# Patient Record
Sex: Female | Born: 1975 | Race: White | Hispanic: No | Marital: Married | Smoking: Former smoker
Health system: Southern US, Community
[De-identification: ages and names within clinical notes are randomized; demographics above are authoritative.]

## PROBLEM LIST (undated history)

## (undated) DIAGNOSIS — G43909 Migraine, unspecified, not intractable, without status migrainosus: Secondary | ICD-10-CM

## (undated) DIAGNOSIS — R87610 Atypical squamous cells of undetermined significance on cytologic smear of cervix (ASC-US): Secondary | ICD-10-CM

## (undated) DIAGNOSIS — Z1379 Encounter for other screening for genetic and chromosomal anomalies: Secondary | ICD-10-CM

## (undated) DIAGNOSIS — T7840XA Allergy, unspecified, initial encounter: Secondary | ICD-10-CM

## (undated) DIAGNOSIS — Z9289 Personal history of other medical treatment: Secondary | ICD-10-CM

## (undated) DIAGNOSIS — Z803 Family history of malignant neoplasm of breast: Secondary | ICD-10-CM

## (undated) DIAGNOSIS — T8332XS Displacement of intrauterine contraceptive device, sequela: Secondary | ICD-10-CM

## (undated) HISTORY — DX: Encounter for other screening for genetic and chromosomal anomalies: Z13.79

## (undated) HISTORY — DX: Family history of malignant neoplasm of breast: Z80.3

## (undated) HISTORY — PX: BREAST ENHANCEMENT SURGERY: SHX7

## (undated) HISTORY — DX: Atypical squamous cells of undetermined significance on cytologic smear of cervix (ASC-US): R87.610

## (undated) HISTORY — DX: Migraine, unspecified, not intractable, without status migrainosus: G43.909

## (undated) HISTORY — DX: Allergy, unspecified, initial encounter: T78.40XA

## (undated) HISTORY — DX: Personal history of other medical treatment: Z92.89

---

## 2004-11-11 ENCOUNTER — Ambulatory Visit: Payer: Self-pay | Admitting: Anesthesiology

## 2006-09-19 ENCOUNTER — Observation Stay: Payer: Self-pay | Admitting: Obstetrics & Gynecology

## 2006-09-20 ENCOUNTER — Inpatient Hospital Stay: Payer: Self-pay | Admitting: Unknown Physician Specialty

## 2009-07-10 ENCOUNTER — Ambulatory Visit: Payer: Self-pay

## 2009-07-11 ENCOUNTER — Inpatient Hospital Stay: Payer: Self-pay

## 2011-05-03 DIAGNOSIS — R87610 Atypical squamous cells of undetermined significance on cytologic smear of cervix (ASC-US): Secondary | ICD-10-CM | POA: Insufficient documentation

## 2011-05-03 HISTORY — DX: Atypical squamous cells of undetermined significance on cytologic smear of cervix (ASC-US): R87.610

## 2011-08-03 HISTORY — PX: INTRAUTERINE DEVICE (IUD) INSERTION: SHX5877

## 2012-11-02 DIAGNOSIS — Z803 Family history of malignant neoplasm of breast: Secondary | ICD-10-CM

## 2012-11-02 HISTORY — DX: Family history of malignant neoplasm of breast: Z80.3

## 2013-06-02 DIAGNOSIS — Z1379 Encounter for other screening for genetic and chromosomal anomalies: Secondary | ICD-10-CM

## 2013-06-02 HISTORY — DX: Encounter for other screening for genetic and chromosomal anomalies: Z13.79

## 2013-11-02 HISTORY — PX: AUGMENTATION MAMMAPLASTY: SUR837

## 2015-06-25 DIAGNOSIS — Z9289 Personal history of other medical treatment: Secondary | ICD-10-CM

## 2015-06-25 HISTORY — DX: Personal history of other medical treatment: Z92.89

## 2015-06-25 LAB — HM PAP SMEAR: HM Pap smear: NEGATIVE

## 2015-06-25 LAB — HM MAMMOGRAPHY

## 2017-07-26 ENCOUNTER — Encounter: Payer: Self-pay | Admitting: Obstetrics and Gynecology

## 2017-07-26 ENCOUNTER — Ambulatory Visit (INDEPENDENT_AMBULATORY_CARE_PROVIDER_SITE_OTHER): Payer: BC Managed Care – PPO | Admitting: Obstetrics and Gynecology

## 2017-07-26 VITALS — BP 138/90 | HR 84 | Ht 67.0 in | Wt 135.0 lb

## 2017-07-26 DIAGNOSIS — Z1151 Encounter for screening for human papillomavirus (HPV): Secondary | ICD-10-CM | POA: Diagnosis not present

## 2017-07-26 DIAGNOSIS — Z124 Encounter for screening for malignant neoplasm of cervix: Secondary | ICD-10-CM | POA: Diagnosis not present

## 2017-07-26 DIAGNOSIS — Z01419 Encounter for gynecological examination (general) (routine) without abnormal findings: Secondary | ICD-10-CM | POA: Diagnosis not present

## 2017-07-26 DIAGNOSIS — Z30431 Encounter for routine checking of intrauterine contraceptive device: Secondary | ICD-10-CM

## 2017-07-26 DIAGNOSIS — Z803 Family history of malignant neoplasm of breast: Secondary | ICD-10-CM

## 2017-07-26 NOTE — Progress Notes (Signed)
PCP:  Patient, No Pcp Per   Chief Complaint  Patient presents with  . Gynecologic Exam     HPI:      [name] is a 41 y.o. [id number] who LMP was Patient's last menstrual period was 05/22/2014 (lmp unknown)., presents today for her annual examination.  Her menses are absent with IUD. Dysmenorrhea none. She does not have intermenstrual bleeding. Pt states her husband notices she is sweating at night, but pt isn't drenching and it doesn't even wake her up. She is cold during the day.  Sex activity: single partner, contraception - IUD. Mirena placed 08/19/16. Strings not visible at 4 wk f/u appt, but IUD in place confirmed with u/s.  Last Pap: June 25, 2015  Results were: no abnormalities . Had neg HPV DNA 2015 Hx of STDs: none  There is a FH of breast cancer in her MGM and pat grt aunt, genetic testing not indicated but done in the past when we thought MGM was younger with breast cancer dx. Pt is BRCA/BART neg 2014.There is no FH of ovarian cancer. The patient does do self-breast exams.  Tobacco use: The patient denies current or previous tobacco use. Alcohol use: social drinker No drug use.  Exercise: not active  She does not get adequate calcium and Vitamin D in her diet.   Past Medical History:  Diagnosis Date  . ASCUS of cervix with negative high risk HPV 05/2011  . Family history of breast cancer 2014   IBIS=17.9%  . Genetic testing of female 06/2013   BRCA/BART NEG  . History of Papanicolaou smear of cervix 06/25/2015   NEG  . Migraine    DR. MEAD    Past Surgical History:  Procedure Laterality Date  . CESAREAN SECTION  09/21/2006   OLIGO  . CESAREAN SECTION  07/11/2009   PREV C/S  . INTRAUTERINE DEVICE (IUD) INSERTION  08/2011   MIRENA    Family History  Problem Relation Age of Onset  . Hypertension Father   . Migraines Father   . Migraines Brother   . Breast cancer Maternal Grandmother 51       DECEASED AGE 59  . Cancer Maternal Grandfather  18       MELANOMA OF SKIN  . Heart disease Paternal Grandfather        MI  . Breast cancer Other        MASTECTOMY  . Cancer Mother 6       thyroid ca - taken out  . Crohn's disease Mother     Social History   Social History  . Marital status: Married    Spouse name: N/A  . Number of children: 2  . Years of education: 16   Occupational History  . TEACHER    Social History Main Topics  . Smoking status: Former Research scientist (life sciences)  . Smokeless tobacco: Never Used  . Alcohol use Yes     Comment: OCC  . Drug use: No  . Sexual activity: Yes    Birth control/ protection: IUD   Other Topics Concern  . Not on file   Social History Narrative  . No narrative on file    Current Meds  Medication Sig  . levonorgestrel (MIRENA) 20 MCG/24HR IUD 1 each by Intrauterine route once.     ROS:  Review of Systems  Constitutional: Negative for fatigue, fever and unexpected weight change.  Respiratory: Negative for cough, shortness of breath and wheezing.   Cardiovascular: Negative for  chest pain, palpitations and leg swelling.  Gastrointestinal: Negative for blood in stool, constipation, diarrhea, nausea and vomiting.  Endocrine: Negative for cold intolerance, heat intolerance and polyuria.  Genitourinary: Negative for dyspareunia, dysuria, flank pain, frequency, genital sores, hematuria, menstrual problem, pelvic pain, urgency, vaginal bleeding, vaginal discharge and vaginal pain.  Musculoskeletal: Negative for back pain, joint swelling and myalgias.  Skin: Negative for rash.  Neurological: Negative for dizziness, syncope, light-headedness, numbness and headaches.  Hematological: Negative for adenopathy.  Psychiatric/Behavioral: Negative for agitation, confusion, sleep disturbance and suicidal ideas. The patient is not nervous/anxious.      Objective: BP 138/90   Pulse 84   Ht _0  (1.702 m)   Wt 135 lb (61.2 kg)   LMP 05/22/2014 (LMP Unknown) Comment: IUD  BMI 21.14 kg/m     Physical Exam  Constitutional: She is oriented to person, place, and time. She appears well-developed and well-nourished.  Genitourinary: Vagina normal and uterus normal. There is no rash or tenderness on the right labia. There is no rash or tenderness on the left labia. No erythema or tenderness in the vagina. No vaginal discharge found. Right adnexum does not display mass and does not display tenderness. Left adnexum does not display mass and does not display tenderness. Cervix does not exhibit motion tenderness or polyp. Uterus is not enlarged or tender.  Genitourinary Comments: IUD STRINGS NOT VISIBLE/PALPABLE  Neck: Normal range of motion. No thyromegaly present.  Cardiovascular: Normal rate, regular rhythm and normal heart sounds.   No murmur heard. Pulmonary/Chest: Effort normal and breath sounds normal. Right breast exhibits no mass, no nipple discharge, no skin change and no tenderness. Left breast exhibits no mass, no nipple discharge, no skin change and no tenderness.  Abdominal: Soft. There is no tenderness. There is no guarding.  Musculoskeletal: Normal range of motion.  Neurological: She is alert and oriented to person, place, and time. No cranial nerve deficit.  Psychiatric: She has a normal mood and affect. Her behavior is normal.  Vitals reviewed.   Assessment/Plan: Encounter for annual routine gynecological examination  Cervical cancer screening - Plan: IGP, Aptima HPV  Screening for HPV (human papillomavirus) - Plan: IGP, Aptima HPV  Family history of breast cancer - Pt doesn't qualify for updated testing. Mammos age 89. Cont SBE.  Encounter for routine checking of intrauterine contraceptive device (IUD) - IUD in place on u/s 12/17 but no strings visible.  No orders of the defined types were placed in this encounter.            GYN counsel breast self exam, mammography screening, adequate intake of calcium and vitamin D, diet and exercise     F/U  Return in  about 1 year (around 07/26/2018).  Alicia B. Copland, PA-C 07/26/2017 2:05 PM

## 2017-07-28 LAB — IGP, APTIMA HPV
HPV Aptima: NEGATIVE
PAP SMEAR COMMENT: 0

## 2018-02-15 ENCOUNTER — Ambulatory Visit (INDEPENDENT_AMBULATORY_CARE_PROVIDER_SITE_OTHER): Payer: BC Managed Care – PPO | Admitting: Nurse Practitioner

## 2018-02-15 ENCOUNTER — Other Ambulatory Visit: Payer: Self-pay

## 2018-02-15 ENCOUNTER — Encounter: Payer: Self-pay | Admitting: Nurse Practitioner

## 2018-02-15 VITALS — BP 109/47 | HR 98 | Temp 98.5°F | Ht 67.0 in | Wt 136.2 lb

## 2018-02-15 DIAGNOSIS — Z7689 Persons encountering health services in other specified circumstances: Secondary | ICD-10-CM

## 2018-02-15 DIAGNOSIS — R87610 Atypical squamous cells of undetermined significance on cytologic smear of cervix (ASC-US): Secondary | ICD-10-CM | POA: Diagnosis not present

## 2018-02-15 DIAGNOSIS — Z23 Encounter for immunization: Secondary | ICD-10-CM

## 2018-02-15 NOTE — Progress Notes (Signed)
Subjective:    Patient ID: Tanya Gillespie, female    DOB: [birth year], 42 y.o.   MRN: [id number]  Tanya Gillespie is a 42 y.o. female presenting on 02/15/2018 for [place] Provider Most recent primary care by [place].  Records in Epic  Abnormal Pap Pt has history of ASCUS and negative HPV with her last pap smear.  She is not currently symptomatic, but requests screening PAP smear to re-evaluate for cervical cancer.  She did not have any treatment performed at that time.  She did not receive HPV vaccine.  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  Mother hx thyroid ca: crohn's  Health Maintenance: Tetanus vaccine due  Past Medical History:  Diagnosis Date  . ASCUS of cervix with negative high risk HPV 05/2011  . Family history of breast cancer 2014   IBIS=17.9%  . Genetic testing of female 06/2013   BRCA/BART NEG  . History of Papanicolaou smear of cervix 06/25/2015   NEG  . Migraine    DR. MEAD   Past Surgical History:  Procedure Laterality Date  . BREAST ENHANCEMENT SURGERY    . CESAREAN SECTION  09/21/2006   OLIGO  . CESAREAN SECTION  07/11/2009   PREV C/S  . INTRAUTERINE DEVICE (IUD) INSERTION  08/2011   MIRENA   Social History   Socioeconomic History  . Marital status: Married    Spouse name: Not on file  . Number of children: 2  . Years of education: 8  . Highest education level: Not on file  Occupational History  . Occupation: TEACHER  Social Needs  . Financial resource strain: Not on file  . Food insecurity:    Worry: Not on file    Inability: Not on file  . Transportation needs:    Medical: Not on file    Non-medical: Not on file  Tobacco Use  . Smoking status: Former Smoker    Packs/day: 0.50    Years: 2.00    Pack years: 1.00    Types: Cigarettes  . Smokeless tobacco: Never Used  Substance and Sexual Activity  . Alcohol use: Yes    Alcohol/week: 1.2 oz    Types: 2 Cans of beer per week   Comment: OCC  . Drug use: No  . Sexual activity: Yes    Birth control/protection: IUD  Lifestyle  . Physical activity:    Days per week: Not on file    Minutes per session: Not on file  . Stress: Not on file  Relationships  . Social connections:    Talks on phone: Not on file    Gets together: Not on file    Attends religious service: Not on file    Active member of club or organization: Not on file    Attends meetings of clubs or organizations: Not on file    Relationship status: Not on file  . Intimate partner violence:    Fear of current or ex partner: Not on file    Emotionally abused: Not on file    Physically abused: Not on file    Forced sexual activity: Not on file  Other Topics Concern  . Not on file  Social History Narrative  . Not on file   Family History  Problem Relation Age of Onset  . Hypertension Father   . Migraines Father   . Migraines Brother   . Breast cancer Maternal Grandmother 6  DECEASED AGE 3  . Cancer Maternal Grandfather 54       MELANOMA OF SKIN  . Heart disease Paternal Grandfather        MI  . Breast cancer Other        MASTECTOMY  . Cancer Mother 62       thyroid ca - taken out  . Crohn's disease Mother    Current Outpatient Medications on File Prior to Visit  Medication Sig  . Cholecalciferol (PA VITAMIN D-3 GUMMY PO) Take by mouth.  . clarithromycin (BIAXIN) 500 MG tablet Take 500 mg by mouth 2 (two) times daily.  Marland Kitchen levonorgestrel (MIRENA) 20 MCG/24HR IUD 1 each by Intrauterine route once.  . tobramycin-dexamethasone (TOBRADEX) ophthalmic solution    No current facility-administered medications on file prior to visit.     Review of Systems  Constitutional: Negative for chills and fever.  HENT: Negative for congestion and sore throat.   Eyes: Negative for pain.  Respiratory: Negative for cough, shortness of breath and wheezing.   Cardiovascular: Negative for chest pain, palpitations and leg swelling.  Gastrointestinal:  Negative for abdominal pain, blood in stool, constipation, diarrhea, nausea and vomiting.  Endocrine: Negative for polydipsia.  Genitourinary: Negative for dysuria, frequency, hematuria and urgency.  Musculoskeletal: Negative for back pain, myalgias and neck pain.  Skin: Negative.  Negative for rash.  Allergic/Immunologic: Negative for environmental allergies.  Neurological: Negative for dizziness, weakness and headaches.  Hematological: Does not bruise/bleed easily.  Psychiatric/Behavioral: Negative for dysphoric mood and suicidal ideas. The patient is not nervous/anxious.    Per HPI unless specifically indicated above      Objective:    BP (!) 109/47 (BP Location: Right Arm, Patient Position: Sitting, Cuff Size: Normal)   Pulse 98   Temp 98.5 F (36.9 C) (Oral)   Ht '5\' 7"'  (1.702 m)   Wt 136 lb 3.2 oz (61.8 kg)   BMI 21.33 kg/m   Wt Readings from Last 3 Encounters:  02/15/18 136 lb 3.2 oz (61.8 kg)  07/26/17 135 lb (61.2 kg)  10/12/16 136 lb (61.7 kg)    Physical Exam  Constitutional: She is oriented to person, place, and time. She appears well-developed and well-nourished. No distress.  HENT:  Head: Normocephalic and atraumatic.  Cardiovascular: Normal rate, regular rhythm, S1 normal, S2 normal, normal heart sounds and intact distal pulses.  Pulmonary/Chest: Effort normal and breath sounds normal. No respiratory distress.  Neurological: She is alert and oriented to person, place, and time.  Skin: Skin is warm and dry.  Psychiatric: She has a normal mood and affect. Her behavior is normal.  Vitals reviewed.  Results for orders placed or performed in visit on 07/26/17  IGP, Aptima HPV  Result Value Ref Range   DIAGNOSIS: Comment    Specimen adequacy: Comment    Clinician Provided ICD10 Comment    Performed by: Comment    PAP Smear Comment .    Note: Comment    Test Methodology Comment    HPV Aptima Negative Negative      Assessment & Plan:   Problem List Items  Addressed This Visit      Other   ASCUS of cervix with negative high risk HPV No followup after last ASCUS.  Pt desires repeat PAP smear.  No current symptoms or pain to indicate any changes.    Other Visit Diagnoses    Need for diphtheria-tetanus-pertussis (Tdap) vaccine    -  Primary Pt due today for tetanus vaccination.  Administer Tdap vaccine.   Relevant Orders   Tdap vaccine greater than or equal to 7yo IM (Completed)   Encounter to establish care     Previous primary care was at [facility].  Records will be requested.  Past medical, family, and surgical history reviewed w/ pt.         Follow up plan: Return in about 8 weeks (around 04/11/2018) for annual physical.  Cassell Smiles, DNP, AGPCNP-BC Adult Gerontology Primary Care Nurse Practitioner [facility] 02/15/2018, 2:42 PM

## 2018-02-15 NOTE — Patient Instructions (Addendum)
Tanya Gillespie,   Thank you for coming in to clinic today.  You will be due for FASTING BLOOD WORK.  This means you should eat no food or drink after midnight.  Drink only water or coffee without cream/sugar on the morning of your lab visit. - Please go ahead and schedule a "Lab Only" visit in the morning at the clinic for lab draw at your annual physical. - Your results will be available about 2-3 days after blood draw.  If you have set up a MyChart account, you can can log in to MyChart online to view your results and a brief explanation. Also, we can discuss your results together at your next office visit if you would like.   Please schedule a follow-up appointment with Wilhelmina McardleLauren Belal Scallon, AGNP. Return in about 8 weeks (around 04/11/2018) for annual physical.  If you have any other questions or concerns, please feel free to call the clinic or send a message through MyChart. You may also schedule an earlier appointment if necessary.  You will receive a survey after today's visit either digitally by e-mail or paper by [employer] mail. Your experiences and feedback matter to Tanya Gillespie.  Please respond so we know how we are doing as we provide care for you.   Wilhelmina McardleLauren Liviya Santini, DNP, AGNP-BC Adult Gerontology Nurse Practitioner [facility], [facility]

## 2018-02-16 DIAGNOSIS — Z23 Encounter for immunization: Secondary | ICD-10-CM | POA: Diagnosis not present

## 2018-04-05 ENCOUNTER — Other Ambulatory Visit: Payer: Self-pay

## 2018-04-05 ENCOUNTER — Encounter: Payer: Self-pay | Admitting: Nurse Practitioner

## 2018-04-05 ENCOUNTER — Ambulatory Visit (INDEPENDENT_AMBULATORY_CARE_PROVIDER_SITE_OTHER): Payer: BC Managed Care – PPO | Admitting: Nurse Practitioner

## 2018-04-05 VITALS — BP 103/60 | HR 83 | Temp 98.7°F | Ht 67.0 in | Wt 137.8 lb

## 2018-04-05 DIAGNOSIS — Z0000 Encounter for general adult medical examination without abnormal findings: Secondary | ICD-10-CM

## 2018-04-05 NOTE — Progress Notes (Signed)
Subjective:    Patient ID: Tanya Gillespie, female    DOB: 13-Feb-1976, 42 y.o.   MRN: [id number]  Tanya Gillespie is a 42 y.o. female presenting on 04/05/2018 for Annual Exam and low sex drive (x 1-2 mths)   HPI Annual Physical Exam Patient has been feeling well, a little down and less "bubbly".  They have no other acute concerns today. Sleeps 7-9 hours per night uninterrupted.  HEALTH MAINTENANCE: Weight/BMI: stable and healthy Physical activity: 3x per week Diet: generally healthy, fast food occasionally Seatbelt: always Sunscreen: sometimes PAP: August 2018 - [facility] Mammogram: baseline obtained at 78.  Family history of breast ca.  Will likely resume w/ annual mammo at age 63. HIV/HEP C: mutually monogamous relationship Optometry: regular Dentistry: regular  VACCINES: Tetanus: April 2019  Depression screen [facility] 2/9 04/05/2018 02/15/2018  Decreased Interest 1 0  Down, Depressed, Hopeless 1 0  PHQ - 2 Score 2 0  Altered sleeping 0 -  Tired, decreased energy 0 -  Change in appetite 0 -  Feeling bad or failure about yourself  0 -  Trouble concentrating 0 -  Moving slowly or fidgety/restless 0 -  Suicidal thoughts 0 -  PHQ-9 Score 2 -  Difficult doing work/chores Not difficult at all -   GAD 7 : Generalized Anxiety Score 04/05/2018  Nervous, Anxious, on Edge 0  Control/stop worrying 0  Worry too much - different things 0  Trouble relaxing 0  Restless 0  Easily annoyed or irritable 1  Afraid - awful might happen 0  Total GAD 7 Score 1  Anxiety Difficulty Not difficult at all     Past Medical History:  Diagnosis Date  . ASCUS of cervix with negative high risk HPV 05/2011  . Family history of breast cancer 2014   IBIS=17.9%  . Genetic testing of female 06/2013   BRCA/BART NEG  . History of Papanicolaou smear of cervix 06/25/2015   NEG  . Migraine    DR. MEAD   Past Surgical History:  Procedure Laterality Date  . BREAST ENHANCEMENT SURGERY    . CESAREAN  SECTION  09/21/2006   OLIGO  . CESAREAN SECTION  07/11/2009   PREV C/S  . INTRAUTERINE DEVICE (IUD) INSERTION  08/2011   MIRENA   Social History   Socioeconomic History  . Marital status: Married    Spouse name: Not on file  . Number of children: 2  . Years of education: 16  . Highest education level: Bachelor's degree (e.g., BA, AB, BS)  Occupational History  . Occupation: TEACHER  Social Needs  . Financial resource strain: Not on file  . Food insecurity:    Worry: Not on file    Inability: Not on file  . Transportation needs:    Medical: Not on file    Non-medical: Not on file  Tobacco Use  . Smoking status: Former Smoker    Packs/day: 0.50    Years: 2.00    Pack years: 1.00    Types: Cigarettes  . Smokeless tobacco: Never Used  Substance and Sexual Activity  . Alcohol use: Yes    Alcohol/week: 1.2 oz    Types: 2 Cans of beer per week    Comment: OCC  . Drug use: No  . Sexual activity: Yes    Birth control/protection: IUD  Lifestyle  . Physical activity:    Days per week: Not on file    Minutes per session: Not on file  . Stress: Not  on file  Relationships  . Social connections:    Talks on phone: Not on file    Gets together: Not on file    Attends religious service: Not on file    Active member of club or organization: Not on file    Attends meetings of clubs or organizations: Not on file    Relationship status: Not on file  . Intimate partner violence:    Fear of current or ex partner: No    Emotionally abused: No    Physically abused: No    Forced sexual activity: No  Other Topics Concern  . Not on file  Social History Narrative  . Not on file   Family History  Problem Relation Age of Onset  . Hypertension Father   . Migraines Father   . Migraines Brother   . Breast cancer Maternal Grandmother 21       DECEASED AGE 89  . Cancer Maternal Grandfather 3       MELANOMA OF SKIN  . Heart disease Paternal Grandfather        MI  . Breast cancer  Other        MASTECTOMY  . Cancer Mother 8       thyroid ca - taken out  . Crohn's disease Mother   . Alzheimer's disease Paternal Grandmother    Current Outpatient Medications on File Prior to Visit  Medication Sig  . Cholecalciferol (PA VITAMIN D-3 GUMMY PO) Take by mouth.  . levonorgestrel (MIRENA) 20 MCG/24HR IUD 1 each by Intrauterine route once.   No current facility-administered medications on file prior to visit.     Review of Systems  Constitutional: Positive for fatigue. Negative for chills and fever.  HENT: Negative for congestion and sore throat.   Eyes: Negative for pain.  Respiratory: Negative for cough, shortness of breath and wheezing.   Cardiovascular: Negative for chest pain, palpitations and leg swelling.  Gastrointestinal: Negative for abdominal pain, blood in stool, constipation, diarrhea, nausea and vomiting.  Endocrine: Negative for polydipsia.  Genitourinary: Negative for dysuria, frequency, hematuria and urgency.  Musculoskeletal: Negative for back pain, myalgias and neck pain.  Skin: Negative.  Negative for rash.  Allergic/Immunologic: Negative for environmental allergies.  Neurological: Negative for dizziness, weakness and headaches.  Hematological: Does not bruise/bleed easily.  Psychiatric/Behavioral: Negative for dysphoric mood, sleep disturbance and suicidal ideas. The patient is not nervous/anxious.        Mildly down, more withdrawn socially   Per HPI unless specifically indicated above     Objective:    BP 103/60 (BP Location: Right Arm, Patient Position: Sitting, Cuff Size: Normal)   Pulse 83   Temp 98.7 F (37.1 C) (Oral)   Ht '5\' 7"'  (1.702 m)   Wt 137 lb 12.8 oz (62.5 kg)   BMI 21.58 kg/m   Wt Readings from Last 3 Encounters:  04/05/18 137 lb 12.8 oz (62.5 kg)  02/15/18 136 lb 3.2 oz (61.8 kg)  07/26/17 135 lb (61.2 kg)    Physical Exam  Constitutional: She is oriented to person, place, and time. She appears well-developed and  well-nourished. No distress.  HENT:  Head: Normocephalic and atraumatic.  Right Ear: External ear normal.  Left Ear: External ear normal.  Nose: Nose normal.  Mouth/Throat: Oropharynx is clear and moist.  Eyes: Pupils are equal, round, and reactive to light. Conjunctivae are normal.  Neck: Normal range of motion. Neck supple. No JVD present. No tracheal deviation present. No thyromegaly present.  Cardiovascular: Normal rate, regular rhythm, normal heart sounds and intact distal pulses. Exam reveals no gallop and no friction rub.  No murmur heard. Pulmonary/Chest: Effort normal and breath sounds normal. No respiratory distress.  Breast - Normal exam w/ symmetric breasts, no mass, no nipple discharge, no skin changes or tenderness.  Pt with stable breast implants.    Abdominal: Soft. Bowel sounds are normal. She exhibits no distension. There is no tenderness.  Genitourinary:  Genitourinary Comments: Exam deferred by pt to GYN  Musculoskeletal: Normal range of motion.  Lymphadenopathy:    She has no cervical adenopathy.  Neurological: She is alert and oriented to person, place, and time. No cranial nerve deficit.  Skin: Skin is warm and dry. Capillary refill takes less than 2 seconds.  Psychiatric: She has a normal mood and affect. Her behavior is normal. Judgment and thought content normal.  Nursing note and vitals reviewed.    Results for orders placed or performed in visit on 07/26/17  IGP, Aptima HPV  Result Value Ref Range   DIAGNOSIS: Comment    Specimen adequacy: Comment    Clinician Provided ICD10 Comment    Performed by: Comment    PAP Smear Comment .    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  Well adult w/ acute concerns about very mild social withdrawal.  No anhedonia noted and no clinically significant depression found today. Denies SI/HI and has no plans to carry out if SI/HI arise.   Plan: 1. Obtain health maintenance screenings as above according to age. - Increase physical activity to 30 minutes most days of the week.  - Eat healthy diet high in vegetables and fruits; low in refined carbohydrates. 2. Encouraged stress management skills, personal time for rejuvenation. 3. Decreased libido likely connected, work to plan date nights to increase this interest. 4. Consider counseling/therapy as pt moves into new life phase after school program completed. 5. Monitor nevus for change over time.  Recommend dermatology screening. 6. Return 1 year for annual physical and in 6-8 weeks if moods not improving or at any time for worsening with anhedonia/SI.    Follow up plan: Return in about 1 year (around 04/06/2019) for annual physical AND as needed in 6-8 weeks if moods are not improving.Cassell Smiles, DNP, AGPCNP-BC Adult Gerontology Primary Care Nurse Practitioner [facility] 04/05/2018, 11:56 AM

## 2018-04-05 NOTE — Patient Instructions (Addendum)
Kathi Deriffany J Inscore,   Thank you for coming in to clinic today.  1. Decreased libido and general mood: - invest some time in yourself to do something fun, intentionally decreasing stress at least 5 minutes daily.  Work on 30 minutes 3-4 days per week. - Plan date night and prepare in advance - Consider counseling/therapist as needed.  2. Continue eating healthy diet and staying physically active.  3. Lab results will be released to mychart once they return.  COUNSELING ONLY  Self Referral: 1. [facility]   Address: [address], [place], [place] [address] Hours: Open today  9AM-7PM Phone: [phone]  2. Anell Barrheryl [facility] [employer]  - [facility] Address: [address], [place], [place] [address] Phone: [phone]   Please schedule a follow-up appointment with Wilhelmina McardleLauren Haroldine Redler, AGNP. Return in about 1 year (around 04/06/2019) for annual physical AND as needed in 6-8 weeks if moods are not improving..  If you have any other questions or concerns, please feel free to call the clinic or send a message through MyChart. You may also schedule an earlier appointment if necessary.  You will receive a survey after today's visit either digitally by e-mail or paper by [employer] mail. Your experiences and feedback matter to us.  Please respond so we know how we are doing as we provide care for you.   Wilhelmina McardleLauren Terrill Alperin, DNP, AGNP-BC Adult Gerontology Nurse Practitioner [facility] [place] [facility], [facility]

## 2018-04-06 LAB — CBC WITH DIFFERENTIAL/PLATELET
Basophils Absolute: 37 cells/uL (ref 0–200)
Basophils Relative: 0.6 %
Eosinophils Absolute: 87 cells/uL (ref 15–500)
Eosinophils Relative: 1.4 %
HCT: 41.8 % (ref 35.0–45.0)
Hemoglobin: 14 g/dL (ref 11.7–15.5)
Lymphs Abs: 1246 cells/uL (ref 850–3900)
MCH: 30.9 pg (ref 27.0–33.0)
MCHC: 33.5 g/dL (ref 32.0–36.0)
MCV: 92.3 fL (ref 80.0–100.0)
MPV: 9.8 fL (ref 7.5–12.5)
Monocytes Relative: 6.1 %
Neutro Abs: 4452 cells/uL (ref 1500–7800)
Neutrophils Relative %: 71.8 %
Platelets: 221 10*3/uL (ref 140–400)
RBC: 4.53 10*6/uL (ref 3.80–5.10)
RDW: 13.1 % (ref 11.0–15.0)
Total Lymphocyte: 20.1 %
WBC mixed population: 378 cells/uL (ref 200–950)
WBC: 6.2 10*3/uL (ref 3.8–10.8)

## 2018-04-06 LAB — TSH: TSH: 2.77 mIU/L

## 2018-04-06 LAB — HEMOGLOBIN A1C
Hgb A1c MFr Bld: 4.6 % of total Hgb (ref <5.7)
Mean Plasma Glucose: 85 (calc)
eAG (mmol/L): 4.7 (calc)

## 2018-04-06 LAB — LIPID PANEL
Cholesterol: 204 mg/dL — ABNORMAL HIGH (ref <200)
HDL: 93 mg/dL (ref >50)
LDL Cholesterol (Calc): 97 mg/dL (calc)
Non-HDL Cholesterol (Calc): 111 mg/dL (calc) (ref <130)
Total CHOL/HDL Ratio: 2.2 (calc) (ref <5.0)
Triglycerides: 55 mg/dL (ref <150)

## 2018-04-06 LAB — COMPLETE METABOLIC PANEL WITH GFR
AG Ratio: 1.7 (calc) (ref 1.0–2.5)
ALT: 10 U/L (ref 6–29)
AST: 17 U/L (ref 10–30)
Albumin: 4.7 g/dL (ref 3.6–5.1)
Alkaline phosphatase (APISO): 38 U/L (ref 33–115)
BUN: 13 mg/dL (ref 7–25)
CO2: 32 mmol/L (ref 20–32)
Calcium: 10 mg/dL (ref 8.6–10.2)
Chloride: 105 mmol/L (ref 98–110)
Creat: 0.82 mg/dL (ref 0.50–1.10)
GFR, Est African American: 104 mL/min/{1.73_m2} (ref >=60)
GFR, Est Non African American: 90 mL/min/{1.73_m2} (ref >=60)
Globulin: 2.7 g/dL (calc) (ref 1.9–3.7)
Glucose, Bld: 83 mg/dL (ref 65–99)
Potassium: 5.1 mmol/L (ref 3.5–5.3)
Sodium: 142 mmol/L (ref 135–146)
Total Bilirubin: 0.7 mg/dL (ref 0.2–1.2)
Total Protein: 7.4 g/dL (ref 6.1–8.1)

## 2018-08-30 ENCOUNTER — Ambulatory Visit: Payer: BC Managed Care – PPO | Admitting: Obstetrics and Gynecology

## 2018-10-24 ENCOUNTER — Ambulatory Visit (INDEPENDENT_AMBULATORY_CARE_PROVIDER_SITE_OTHER): Payer: BC Managed Care – PPO | Admitting: Obstetrics and Gynecology

## 2018-10-24 ENCOUNTER — Encounter: Payer: Self-pay | Admitting: Obstetrics and Gynecology

## 2018-10-24 VITALS — BP 100/70 | HR 90 | Ht 67.0 in | Wt 139.0 lb

## 2018-10-24 DIAGNOSIS — Z1239 Encounter for other screening for malignant neoplasm of breast: Secondary | ICD-10-CM

## 2018-10-24 DIAGNOSIS — Z803 Family history of malignant neoplasm of breast: Secondary | ICD-10-CM

## 2018-10-24 DIAGNOSIS — Z01419 Encounter for gynecological examination (general) (routine) without abnormal findings: Secondary | ICD-10-CM | POA: Diagnosis not present

## 2018-10-24 DIAGNOSIS — Z30431 Encounter for routine checking of intrauterine contraceptive device: Secondary | ICD-10-CM

## 2018-10-24 NOTE — Patient Instructions (Signed)
I value your feedback and entrusting us with your care. If you get a [facility] patient survey, I would appreciate you taking the time to let us know about your experience today. Thank you! 

## 2018-10-24 NOTE — Progress Notes (Signed)
PCP:  Mikey College, NP   Chief Complaint  Patient presents with  . Gynecologic Exam     HPI:      Tanya Gillespie is a 42 y.o. [id number] who LMP was No LMP recorded. (Menstrual status: IUD)., presents today for her annual examination.  Her menses are infrequent, light with IUD. Dysmenorrhea none. Still has night sweats but improved.   Sex activity: single partner, contraception - IUD. Mirena placed 08/19/16. Strings not visible at 4 wk f/u appt, but IUD in place confirmed with u/s.   Last Pap: 07/26/17  Results were: no abnormalities /neg HPV DNA.  Hx of STDs: none  There is a FH of breast cancer in her MGM and pat grt aunt, genetic testing not indicated but done in the past when we thought MGM was younger with breast cancer dx. Pt is BRCA/BART neg 2014.There is no FH of ovarian cancer. The patient does do self-breast exams.  Tobacco use: The patient denies current or previous tobacco use. Alcohol use: social drinker No drug use.  Exercise: somewhat active  She does get adequate calcium and Vitamin D in her diet. Labs with PCP.   Past Medical History:  Diagnosis Date  . ASCUS of cervix with negative high risk HPV 05/2011  . Family history of breast cancer 2014   IBIS=17.9%  . Genetic testing of female 06/2013   BRCA/BART NEG  . History of Papanicolaou smear of cervix 06/25/2015   NEG  . Migraine    DR. MEAD    Past Surgical History:  Procedure Laterality Date  . BREAST ENHANCEMENT SURGERY    . CESAREAN SECTION  09/21/2006   OLIGO  . CESAREAN SECTION  07/11/2009   PREV C/S  . INTRAUTERINE DEVICE (IUD) INSERTION  08/2011   MIRENA    Family History  Problem Relation Age of Onset  . Hypertension Father   . Migraines Father   . Migraines Brother   . Breast cancer Maternal Grandmother 67       DECEASED AGE 70  . Cancer Maternal Grandfather 88       MELANOMA OF SKIN  . Heart disease Paternal Grandfather        MI  . Breast cancer Other    MASTECTOMY  . Cancer Mother 2       thyroid ca - taken out  . Crohn's disease Mother   . Alzheimer's disease Paternal Grandmother     Social History   Socioeconomic History  . Marital status: Married    Spouse name: Not on file  . Number of children: 2  . Years of education: 16  . Highest education level: Bachelor's degree (e.g., BA, AB, BS)  Occupational History  . Occupation: TEACHER  Social Needs  . Financial resource strain: Not on file  . Food insecurity:    Worry: Not on file    Inability: Not on file  . Transportation needs:    Medical: Not on file    Non-medical: Not on file  Tobacco Use  . Smoking status: Former Smoker    Packs/day: 0.50    Years: 2.00    Pack years: 1.00    Types: Cigarettes  . Smokeless tobacco: Never Used  Substance and Sexual Activity  . Alcohol use: Yes    Alcohol/week: 2.0 standard drinks    Types: 2 Cans of beer per week    Comment: OCC  . Drug use: No  . Sexual activity: Yes    Birth  control/protection: I.U.D.    Comment: Mirena  Lifestyle  . Physical activity:    Days per week: Not on file    Minutes per session: Not on file  . Stress: Not on file  Relationships  . Social connections:    Talks on phone: Not on file    Gets together: Not on file    Attends religious service: Not on file    Active member of club or organization: Not on file    Attends meetings of clubs or organizations: Not on file    Relationship status: Not on file  . Intimate partner violence:    Fear of current or ex partner: No    Emotionally abused: No    Physically abused: No    Forced sexual activity: No  Other Topics Concern  . Not on file  Social History Narrative  . Not on file    Current Meds  Medication Sig  . Cholecalciferol (PA VITAMIN D-3 GUMMY PO) Take by mouth.  . levonorgestrel (MIRENA) 20 MCG/24HR IUD 1 each by Intrauterine route once.     ROS:  Review of Systems  Constitutional: Negative for fatigue, fever and unexpected  weight change.  Respiratory: Negative for cough, shortness of breath and wheezing.   Cardiovascular: Negative for chest pain, palpitations and leg swelling.  Gastrointestinal: Negative for blood in stool, constipation, diarrhea, nausea and vomiting.  Endocrine: Negative for cold intolerance, heat intolerance and polyuria.  Genitourinary: Negative for dyspareunia, dysuria, flank pain, frequency, genital sores, hematuria, menstrual problem, pelvic pain, urgency, vaginal bleeding, vaginal discharge and vaginal pain.  Musculoskeletal: Negative for back pain, joint swelling and myalgias.  Skin: Negative for rash.  Neurological: Negative for dizziness, syncope, light-headedness, numbness and headaches.  Hematological: Negative for adenopathy.  Psychiatric/Behavioral: Negative for agitation, confusion, sleep disturbance and suicidal ideas. The patient is not nervous/anxious.      Objective: BP 100/70   Pulse 90   Ht _0  (1.702 m)   Wt 139 lb (63 kg)   BMI 21.77 kg/m    Physical Exam Constitutional:      Appearance: She is well-developed.  Genitourinary:     Vagina and uterus normal.     No vaginal discharge, erythema or tenderness.     No cervical motion tenderness or polyp.     Uterus is not enlarged or tender.     No right or left adnexal mass present.     Right adnexa not tender.     Left adnexa not tender.     Genitourinary Comments: IUD STRINGS NOT VISIBLE/PALPABLE  Neck:     Musculoskeletal: Normal range of motion.     Thyroid: No thyromegaly.  Cardiovascular:     Rate and Rhythm: Normal rate and regular rhythm.     Heart sounds: Normal heart sounds. No murmur.  Pulmonary:     Effort: Pulmonary effort is normal.     Breath sounds: Normal breath sounds.  Chest:     Breasts:        Right: No mass, nipple discharge, skin change or tenderness.        Left: No mass, nipple discharge, skin change or tenderness.  Abdominal:     Palpations: Abdomen is soft.     Tenderness:  There is no abdominal tenderness. There is no guarding.  Musculoskeletal: Normal range of motion.  Neurological:     Mental Status: She is alert and oriented to person, place, and time.     Cranial Nerves: No cranial  nerve deficit.  Psychiatric:        Behavior: Behavior normal.  Vitals signs reviewed.     Assessment/Plan: Encounter for annual routine gynecological examination  Encounter for routine checking of intrauterine contraceptive device (IUD) - IUD strings not visible/palpable. Confirmed on u/s in past. Due for rem 10/22.  Screening for breast cancer - Pt to sched mammo - Plan: MM 3D SCREEN BREAST BILATERAL  Family history of breast cancer - BRCA neg but doesn't qualify for updated cancer genetic testing.          GYN counsel breast self exam, mammography screening, adequate intake of calcium and vitamin D, diet and exercise     F/U  Return in about 1 year (around 10/25/2019).  Allee Busk B. Dywane Peruski, PA-C 10/24/2018 10:20 AM

## 2018-11-16 ENCOUNTER — Other Ambulatory Visit: Payer: Self-pay | Admitting: Obstetrics and Gynecology

## 2018-11-16 ENCOUNTER — Ambulatory Visit
Admission: RE | Admit: 2018-11-16 | Discharge: 2018-11-16 | Disposition: A | Discharge status: Home / Self Care | Payer: BC Managed Care – PPO | Source: Ambulatory Visit | Attending: Obstetrics and Gynecology | Admitting: Obstetrics and Gynecology

## 2018-11-16 DIAGNOSIS — Z1239 Encounter for other screening for malignant neoplasm of breast: Secondary | ICD-10-CM

## 2018-11-23 ENCOUNTER — Encounter: Payer: Self-pay | Admitting: Obstetrics and Gynecology

## 2019-04-03 ENCOUNTER — Other Ambulatory Visit: Payer: BC Managed Care – PPO

## 2019-04-06 ENCOUNTER — Encounter: Payer: BC Managed Care – PPO | Admitting: Nurse Practitioner

## 2019-05-29 ENCOUNTER — Other Ambulatory Visit: Payer: BC Managed Care – PPO

## 2019-06-02 ENCOUNTER — Encounter: Payer: BC Managed Care – PPO | Admitting: Nurse Practitioner

## 2019-10-10 ENCOUNTER — Other Ambulatory Visit: Payer: Self-pay | Admitting: Obstetrics and Gynecology

## 2019-10-10 DIAGNOSIS — Z1231 Encounter for screening mammogram for malignant neoplasm of breast: Secondary | ICD-10-CM

## 2019-11-06 ENCOUNTER — Ambulatory Visit (INDEPENDENT_AMBULATORY_CARE_PROVIDER_SITE_OTHER): Payer: BC Managed Care – PPO | Admitting: Obstetrics and Gynecology

## 2019-11-06 ENCOUNTER — Other Ambulatory Visit: Payer: Self-pay

## 2019-11-06 ENCOUNTER — Encounter: Payer: Self-pay | Admitting: Obstetrics and Gynecology

## 2019-11-06 VITALS — BP 128/78 | HR 87 | Ht 67.0 in | Wt 145.0 lb

## 2019-11-06 DIAGNOSIS — Z30431 Encounter for routine checking of intrauterine contraceptive device: Secondary | ICD-10-CM

## 2019-11-06 DIAGNOSIS — T8332XD Displacement of intrauterine contraceptive device, subsequent encounter: Secondary | ICD-10-CM

## 2019-11-06 DIAGNOSIS — Z01419 Encounter for gynecological examination (general) (routine) without abnormal findings: Secondary | ICD-10-CM | POA: Diagnosis not present

## 2019-11-06 DIAGNOSIS — Z1231 Encounter for screening mammogram for malignant neoplasm of breast: Secondary | ICD-10-CM

## 2019-11-06 NOTE — Progress Notes (Signed)
PCP:  Mikey College, NP (Inactive)   Chief Complaint  Patient presents with  . Gynecologic Exam    No complaints     HPI:      [name] is a 44 y.o. [id number] who LMP was No LMP recorded. (Menstrual status: IUD)., presents today for her annual examination.  Her menses are infrequent, light with IUD. Dysmenorrhea none.   Sex activity: single partner, contraception - IUD. Mirena placed 08/19/16. Strings not visible at 4 wk f/u appt, but IUD placement confirmed with u/s in past.  Last Pap: 07/26/17  Results were: no abnormalities /neg HPV DNA.  Hx of STDs: none  Last mammo: 11/16/18 Results were normal, repeat in 12 months. Has 2/21 appt. There is a FH of breast cancer in her MGM and pat grt aunt, genetic testing not indicated but done in the past when we thought MGM was younger with breast cancer dx. Pt is BRCA/BART neg 2014.There is no FH of ovarian cancer. The patient does do self-breast exams.  Tobacco use: The patient denies current or previous tobacco use. Alcohol use: social drinker No drug use.  Exercise: somewhat active  She does get adequate calcium and Vitamin D in her diet. Labs with PCP.   Past Medical History:  Diagnosis Date  . ASCUS of cervix with negative high risk HPV 05/2011  . Family history of breast cancer 2014   IBIS=17.9%  . Genetic testing of female 06/2013   BRCA/BART NEG  . History of Papanicolaou smear of cervix 06/25/2015   NEG  . Migraine    DR. MEAD    Past Surgical History:  Procedure Laterality Date  . AUGMENTATION MAMMAPLASTY Bilateral 2015  . BREAST ENHANCEMENT SURGERY    . CESAREAN SECTION  09/21/2006   OLIGO  . CESAREAN SECTION  07/11/2009   PREV C/S  . INTRAUTERINE DEVICE (IUD) INSERTION  08/2011   MIRENA    Family History  Problem Relation Age of Onset  . Hypertension Father   . Migraines Father   . Migraines Brother   . Breast cancer Maternal Grandmother 34       DECEASED AGE 31  . Cancer Maternal  Grandfather 60       MELANOMA OF SKIN  . Heart disease Paternal Grandfather        MI  . Breast cancer Other        MASTECTOMY  . Cancer Mother 85       thyroid ca - taken out  . Crohn's disease Mother   . Alzheimer's disease Paternal Grandmother     Social History   Socioeconomic History  . Marital status: Married    Spouse name: Not on file  . Number of children: 2  . Years of education: 16  . Highest education level: Bachelor's degree (e.g., BA, AB, BS)  Occupational History  . Occupation: TEACHER  Tobacco Use  . Smoking status: Former Smoker    Packs/day: 0.50    Years: 2.00    Pack years: 1.00    Types: Cigarettes  . Smokeless tobacco: Never Used  Substance and Sexual Activity  . Alcohol use: Yes    Alcohol/week: 2.0 standard drinks    Types: 2 Cans of beer per week    Comment: OCC  . Drug use: No  . Sexual activity: Yes    Birth control/protection: I.U.D.    Comment: Mirena  Other Topics Concern  . Not on file  Social History Narrative  . Not  on file   Social Determinants of Health   Financial Resource Strain:   . Difficulty of Paying Living Expenses: Not on file  Food Insecurity:   . Worried About Charity fundraiser in the Last Year: Not on file  . Ran Out of Food in the Last Year: Not on file  Transportation Needs:   . Lack of Transportation (Medical): Not on file  . Lack of Transportation (Non-Medical): Not on file  Physical Activity:   . Days of Exercise per Week: Not on file  . Minutes of Exercise per Session: Not on file  Stress:   . Feeling of Stress : Not on file  Social Connections:   . Frequency of Communication with Friends and Family: Not on file  . Frequency of Social Gatherings with Friends and Family: Not on file  . Attends Religious Services: Not on file  . Active Member of Clubs or Organizations: Not on file  . Attends Archivist Meetings: Not on file  . Marital Status: Not on file  Intimate Partner Violence:   . Fear  of Current or Ex-Partner: Not on file  . Emotionally Abused: Not on file  . Physically Abused: Not on file  . Sexually Abused: Not on file    Current Meds  Medication Sig  . Cholecalciferol (PA VITAMIN D-3 GUMMY PO) Take by mouth.  . levonorgestrel (MIRENA) 20 MCG/24HR IUD 1 each by Intrauterine route once.     ROS:  Review of Systems  Constitutional: Negative for fatigue, fever and unexpected weight change.  Respiratory: Negative for cough, shortness of breath and wheezing.   Cardiovascular: Negative for chest pain, palpitations and leg swelling.  Gastrointestinal: Negative for blood in stool, constipation, diarrhea, nausea and vomiting.  Endocrine: Negative for cold intolerance, heat intolerance and polyuria.  Genitourinary: Negative for dyspareunia, dysuria, flank pain, frequency, genital sores, hematuria, menstrual problem, pelvic pain, urgency, vaginal bleeding, vaginal discharge and vaginal pain.  Musculoskeletal: Negative for back pain, joint swelling and myalgias.  Skin: Negative for rash.  Neurological: Negative for dizziness, syncope, light-headedness, numbness and headaches.  Hematological: Negative for adenopathy.  Psychiatric/Behavioral: Negative for agitation, confusion, sleep disturbance and suicidal ideas. The patient is not nervous/anxious.      Objective: BP 128/78 (BP Location: Right Arm, Patient Position: Sitting, Cuff Size: Normal)   Pulse 87   Ht '5\' 7"'  (1.702 m)   Wt 145 lb (65.8 kg)   BMI 22.71 kg/m    Physical Exam Constitutional:      Appearance: She is well-developed.  Genitourinary:     Vulva, vagina, uterus, right adnexa and left adnexa normal.     No vulval lesion or tenderness noted.     No vaginal discharge, erythema or tenderness.     No cervical motion tenderness or polyp.     No IUD strings visualized.     Uterus is not enlarged or tender.     No right or left adnexal mass present.     Right adnexa not tender.     Left adnexa not  tender.     Genitourinary Comments: IUD STRINGS NOT VISIBLE/PALPABLE  Neck:     Thyroid: No thyromegaly.  Cardiovascular:     Rate and Rhythm: Normal rate and regular rhythm.     Heart sounds: Normal heart sounds. No murmur.  Pulmonary:     Effort: Pulmonary effort is normal.     Breath sounds: Normal breath sounds.  Chest:     Breasts:  Right: No mass, nipple discharge, skin change or tenderness.        Left: No mass, nipple discharge, skin change or tenderness.  Abdominal:     Palpations: Abdomen is soft.     Tenderness: There is no abdominal tenderness. There is no guarding.  Musculoskeletal:        General: Normal range of motion.     Cervical back: Normal range of motion.  Neurological:     General: No focal deficit present.     Mental Status: She is alert and oriented to person, place, and time.     Cranial Nerves: No cranial nerve deficit.  Skin:    General: Skin is warm and dry.  Psychiatric:        Mood and Affect: Mood normal.        Behavior: Behavior normal.        Thought Content: Thought content normal.        Judgment: Judgment normal.  Vitals reviewed.     Assessment/Plan: Encounter for annual routine gynecological examination  Encounter for routine checking of intrauterine contraceptive device (IUD)--due for removal 10/23.  Intrauterine contraceptive device threads lost, subsequent encounter  Encounter for screening mammogram for malignant neoplasm of breast; pt has mammo sched 2/21         GYN counsel breast self exam, mammography screening, adequate intake of calcium and vitamin D, diet and exercise     F/U  Return in about 1 year (around 11/05/2020).  Neah Sporrer B. Jasa Dundon, PA-C 11/06/2019 2:14 PM

## 2019-11-06 NOTE — Patient Instructions (Signed)
I value your feedback and entrusting us with your care. If you get a [facility] patient survey, I would appreciate you taking the time to let us know about your experience today. Thank you!  As of October 12, 2019, your lab results will be released to your MyChart immediately, before I even have a chance to see them. Please give me time to review them and contact you if there are any abnormalities. Thank you for your patience.  

## 2019-12-08 ENCOUNTER — Ambulatory Visit
Admission: RE | Admit: 2019-12-08 | Discharge: 2019-12-08 | Disposition: A | Discharge status: Home / Self Care | Payer: BC Managed Care – PPO | Source: Ambulatory Visit | Attending: Obstetrics and Gynecology | Admitting: Obstetrics and Gynecology

## 2019-12-08 ENCOUNTER — Encounter: Payer: Self-pay | Admitting: Obstetrics and Gynecology

## 2019-12-08 DIAGNOSIS — Z1231 Encounter for screening mammogram for malignant neoplasm of breast: Secondary | ICD-10-CM | POA: Diagnosis not present

## 2020-07-25 ENCOUNTER — Other Ambulatory Visit: Payer: Self-pay

## 2020-07-25 ENCOUNTER — Encounter: Payer: Self-pay | Admitting: Family Medicine

## 2020-07-25 ENCOUNTER — Ambulatory Visit (INDEPENDENT_AMBULATORY_CARE_PROVIDER_SITE_OTHER): Payer: BC Managed Care – PPO | Admitting: Family Medicine

## 2020-07-25 VITALS — BP 119/61 | HR 83 | Temp 98.3°F | Resp 18 | Ht 67.0 in | Wt 142.6 lb

## 2020-07-25 DIAGNOSIS — R3129 Other microscopic hematuria: Secondary | ICD-10-CM | POA: Insufficient documentation

## 2020-07-25 DIAGNOSIS — Z85820 Personal history of malignant melanoma of skin: Secondary | ICD-10-CM | POA: Insufficient documentation

## 2020-07-25 DIAGNOSIS — Z0000 Encounter for general adult medical examination without abnormal findings: Secondary | ICD-10-CM | POA: Diagnosis not present

## 2020-07-25 DIAGNOSIS — R635 Abnormal weight gain: Secondary | ICD-10-CM | POA: Diagnosis not present

## 2020-07-25 DIAGNOSIS — Z79899 Other long term (current) drug therapy: Secondary | ICD-10-CM

## 2020-07-25 LAB — POCT URINALYSIS DIPSTICK
Bilirubin, UA: NEGATIVE
Glucose, UA: NEGATIVE
Ketones, UA: NEGATIVE
Leukocytes, UA: NEGATIVE
Nitrite, UA: NEGATIVE
Protein, UA: NEGATIVE
Spec Grav, UA: 1.025 (ref 1.010–1.025)
Urobilinogen, UA: 0.2 E.U./dL
pH, UA: 5 (ref 5.0–8.0)

## 2020-07-25 NOTE — Assessment & Plan Note (Signed)
Microscopic hematuria on POCT U/A.  Will send to lab for u/a microscopy.  To follow up after resulted

## 2020-07-25 NOTE — Progress Notes (Signed)
Subjective:    Patient ID: Tanya Gillespie, female    DOB: Dec 26, 1975, 44 y.o.   MRN: [id number]  Tanya Gillespie is a 44 y.o. female presenting on 07/25/2020 for Annual Exam   HPI  HEALTH MAINTENANCE:  Weight/BMI: Normal, BMI 22.33% Physical activity: Stays active  Diet: Regular Seatbelt: Yes Sunscreen: Yes Mammogram:  PAP: Due, Follows with [facility] Skin exam: Follows with dermatology yearly, next visit today HIV & Hep C Screening: Offered, declined GC/CT: Offered, declined Optometry:  Yearly Dentistry: Every 6 months  IMMUNIZATIONS: Influenza: Declined Tetanus: Up to date, 02/16/2018 COVID: Declined  Depression screen [facility] 2/9 07/25/2020 04/05/2018 02/15/2018  Decreased Interest 0 1 0  Down, Depressed, Hopeless 0 1 0  PHQ - 2 Score 0 2 0  Altered sleeping - 0 -  Tired, decreased energy - 0 -  Change in appetite - 0 -  Feeling bad or failure about yourself  - 0 -  Trouble concentrating - 0 -  Moving slowly or fidgety/restless - 0 -  Suicidal thoughts - 0 -  PHQ-9 Score - 2 -  Difficult doing work/chores - Not difficult at all -    Past Medical History:  Diagnosis Date  . ASCUS of cervix with negative high risk HPV 05/2011  . Family history of breast cancer 2014   IBIS=17.9%  . Genetic testing of female 06/2013   BRCA/BART NEG  . History of Papanicolaou smear of cervix 06/25/2015   NEG  . Migraine    DR. MEAD   Past Surgical History:  Procedure Laterality Date  . AUGMENTATION MAMMAPLASTY Bilateral 2015  . BREAST ENHANCEMENT SURGERY    . CESAREAN SECTION  09/21/2006   OLIGO  . CESAREAN SECTION  07/11/2009   PREV C/S  . INTRAUTERINE DEVICE (IUD) INSERTION  08/2011   MIRENA   Social History   Socioeconomic History  . Marital status: Married    Spouse name: Not on file  . Number of children: 2  . Years of education: 16  . Highest education level: Bachelor's degree (e.g., BA, AB, BS)  Occupational History  . Occupation: TEACHER  Tobacco Use    . Smoking status: Former Smoker    Packs/day: 0.50    Years: 2.00    Pack years: 1.00    Types: Cigarettes  . Smokeless tobacco: Never Used  Vaping Use  . Vaping Use: Never used  Substance and Sexual Activity  . Alcohol use: Yes    Alcohol/week: 2.0 standard drinks    Types: 2 Cans of beer per week    Comment: OCC  . Drug use: No  . Sexual activity: Yes    Birth control/protection: I.U.D.    Comment: Mirena  Other Topics Concern  . Not on file  Social History Narrative  . Not on file   Social Determinants of Health   Financial Resource Strain:   . Difficulty of Paying Living Expenses: Not on file  Food Insecurity:   . Worried About Charity fundraiser in the Last Year: Not on file  . Ran Out of Food in the Last Year: Not on file  Transportation Needs:   . Lack of Transportation (Medical): Not on file  . Lack of Transportation (Non-Medical): Not on file  Physical Activity:   . Days of Exercise per Week: Not on file  . Minutes of Exercise per Session: Not on file  Stress:   . Feeling of Stress : Not on file  Social Connections:   .  Frequency of Communication with Friends and Family: Not on file  . Frequency of Social Gatherings with Friends and Family: Not on file  . Attends Religious Services: Not on file  . Active Member of Clubs or Organizations: Not on file  . Attends Archivist Meetings: Not on file  . Marital Status: Not on file  Intimate Partner Violence:   . Fear of Current or Ex-Partner: Not on file  . Emotionally Abused: Not on file  . Physically Abused: Not on file  . Sexually Abused: Not on file   Family History  Problem Relation Age of Onset  . Hypertension Father   . Migraines Father   . Migraines Brother   . Breast cancer Maternal Grandmother 75       DECEASED AGE 25  . Cancer Maternal Grandfather 46       MELANOMA OF SKIN  . Heart disease Paternal Grandfather        MI  . Breast cancer Other        MASTECTOMY  . Cancer Mother 30        thyroid ca - taken out  . Crohn's disease Mother   . Alzheimer's disease Paternal Grandmother    Current Outpatient Medications on File Prior to Visit  Medication Sig  . Cholecalciferol (PA VITAMIN D-3 GUMMY PO) Take by mouth.  . levonorgestrel (MIRENA) 20 MCG/24HR IUD 1 each by Intrauterine route once.   No current facility-administered medications on file prior to visit.    Per HPI unless specifically indicated above     Objective:    BP 119/61 (BP Location: Right Arm, Patient Position: Sitting, Cuff Size: Normal)   Pulse 83   Temp 98.3 F (36.8 C) (Oral)   Resp 18   Ht '5\' 7"'  (1.702 m)   Wt 142 lb 9.6 oz (64.7 kg)   SpO2 100%   BMI 22.33 kg/m   Wt Readings from Last 3 Encounters:  07/25/20 142 lb 9.6 oz (64.7 kg)  11/06/19 145 lb (65.8 kg)  10/24/18 139 lb (63 kg)    Physical Exam Vitals reviewed.  Constitutional:      General: She is not in acute distress.    Appearance: Normal appearance. She is well-developed, well-groomed and normal weight. She is not ill-appearing or toxic-appearing.  HENT:     Head: Normocephalic and atraumatic.     Right Ear: Tympanic membrane, ear canal and external ear normal. There is no impacted cerumen.     Left Ear: Tympanic membrane, ear canal and external ear normal. There is no impacted cerumen.     Nose: Nose normal. No congestion or rhinorrhea.     Mouth/Throat:     Lips: Pink.     Mouth: Mucous membranes are moist.     Pharynx: Oropharynx is clear. Uvula midline. No oropharyngeal exudate or posterior oropharyngeal erythema.  Eyes:     General: Lids are normal. Vision grossly intact. No scleral icterus.       Right eye: No discharge.        Left eye: No discharge.     Extraocular Movements: Extraocular movements intact.     Conjunctiva/sclera: Conjunctivae normal.     Pupils: Pupils are equal, round, and reactive to light.  Neck:     Thyroid: No thyroid mass or thyromegaly.  Cardiovascular:     Rate and Rhythm: Normal  rate and regular rhythm.     Pulses: Normal pulses.          Dorsalis  pedis pulses are 2+ on the right side and 2+ on the left side.     Heart sounds: Normal heart sounds. No murmur heard.  No friction rub. No gallop.   Pulmonary:     Effort: Pulmonary effort is normal. No respiratory distress.     Breath sounds: Normal breath sounds.  Chest:     Comments: Deferred Abdominal:     General: Abdomen is flat. Bowel sounds are normal. There is no distension.     Palpations: Abdomen is soft. There is no hepatomegaly, splenomegaly or mass.     Tenderness: There is no abdominal tenderness. There is no guarding or rebound.     Hernia: No hernia is present.  Genitourinary:    Comments: Deferred Musculoskeletal:        General: Normal range of motion.     Cervical back: Normal range of motion and neck supple. No tenderness.     Right lower leg: No edema.     Left lower leg: No edema.     Comments: Normal tone, strength 5/5 BUE & BLE  Feet:     Right foot:     Skin integrity: Skin integrity normal.     Left foot:     Skin integrity: Skin integrity normal.  Lymphadenopathy:     Cervical: No cervical adenopathy.  Skin:    General: Skin is warm and dry.     Capillary Refill: Capillary refill takes less than 2 seconds.  Neurological:     General: No focal deficit present.     Mental Status: She is alert and oriented to person, place, and time.     Cranial Nerves: No cranial nerve deficit.     Sensory: No sensory deficit.     Motor: No weakness.     Coordination: Coordination normal.     Gait: Gait normal.     Deep Tendon Reflexes: Reflexes normal.  Psychiatric:        Attention and Perception: Attention and perception normal.        Mood and Affect: Mood and affect normal.        Speech: Speech normal.        Behavior: Behavior normal. Behavior is cooperative.        Thought Content: Thought content normal.        Cognition and Memory: Cognition and memory normal.        Judgment:  Judgment normal.     Results for orders placed or performed in visit on 07/25/20  POCT Urinalysis Dipstick  Result Value Ref Range   Color, UA Yellow    Clarity, UA clear    Glucose, UA Negative Negative   Bilirubin, UA negative    Ketones, UA negative    Spec Grav, UA 1.025 1.010 - 1.025   Blood, UA large    pH, UA 5.0 5.0 - 8.0   Protein, UA Negative Negative   Urobilinogen, UA 0.2 0.2 or 1.0 E.U./dL   Nitrite, UA negative    Leukocytes, UA Negative Negative   Appearance     Odor        Assessment & Plan:   Problem List Items Addressed This Visit      Genitourinary   Microscopic hematuria    Microscopic hematuria on POCT U/A.  Will send to lab for u/a microscopy.  To follow up after resulted      Relevant Orders   Urinalysis, microscopic only     Other   History of melanoma  Follows with Dermatology for yearly check.  Next visit today, 07/25/2020.      Annual physical exam - Primary    Annual physical exam without new findings.  Well adult with no acute concerns.  Plan: 1. Obtain health maintenance screenings as above according to age. - Increase physical activity to 30 minutes most days of the week.  - Eat healthy diet high in vegetables and fruits; low in refined carbohydrates. - Screening labs and tests as ordered 2. Return 1 year for annual physical.       Relevant Orders   CBC with Differential   COMPLETE METABOLIC PANEL WITH GFR   POCT Urinalysis Dipstick (Completed)    Other Visit Diagnoses    Weight gain       Relevant Orders   TSH + free T4   Long-term use of high-risk medication       Relevant Orders   Lipid Profile      No orders of the defined types were placed in this encounter.  Follow up plan: Return in about 1 year (around 07/25/2021) for CPE.  Harlin Rain, FNP-C Family Nurse Practitioner [facility] 07/25/2020, 12:33 PM

## 2020-07-25 NOTE — Assessment & Plan Note (Signed)
Follows with Dermatology for yearly check.  Next visit today, 07/25/2020.

## 2020-07-25 NOTE — Patient Instructions (Signed)
We have sent your urine and blood work to the lab for testing.  We will contact you when we receive the results.  Schedule a follow up visit with your OB/GYN provider for your PAP testing  Well Visit: Care Instructions Overview  Well visits can help you stay healthy. Your provider has checked your overall health and may have suggested ways to take good care of yourself. Your provider also may have recommended tests. At home, you can help prevent illness with healthy eating, regular exercise, and other steps.  Follow-up care is a key part of your treatment and safety. Be sure to make and go to all appointments, and call your provider if you are having problems. It's also a good idea to know your test results and keep a list of the medicines you take.  How can you care for yourself at home?   Get screening tests that you and your doctor decide on. Screening helps find diseases before any symptoms appear.   Eat healthy foods. Choose fruits, vegetables, whole grains, protein, and low-fat dairy foods. Limit fat, especially saturated fat. Reduce salt in your diet.   Limit alcohol. If you are a man, have no more than 2 drinks a day or 14 drinks a week. If you are a woman, have no more than 1 drink a day or 7 drinks a week.   Get at least 30 minutes of physical activity on most days of the week.  We recommend you go no more than 2 days in a row without exercise. Walking is a good choice. You also may want to do other activities, such as running, swimming, cycling, or playing tennis or team sports. Discuss any changes in your exercise program with your provider.   Reach and stay at a healthy weight. This will lower your risk for many problems, such as obesity, diabetes, heart disease, and high blood pressure.   Do not smoke or allow others to smoke around you. If you need help quitting, talk to your provider about stop-smoking programs and medicines. These can increase your chances of quitting  for good.  Can call 1-800-QUIT-NOW (240-139-2094) for the [facility], assistance with smoking cessation.   Care for your mental health. It is easy to get weighed down by worry and stress. Learn strategies to manage stress, like deep breathing and mindfulness, and stay connected with your family and community. If you find you often feel sad or hopeless, talk with your provider. Treatment can help.   Talk to your provider about whether you have any risk factors for sexually transmitted infections (STIs). You can help prevent STIs if you wait to have sex with a new partner (or partners) until you've each been tested for STIs. It also helps if you use condoms (female or female condoms) and if you limit your sex partners to one person who only has sex with you. Vaccines are available for some STIs, such as HPV (these are age dependent).   Use birth control if it's important to you to prevent pregnancy. Talk with your provider about the choices available and what might be best for you.   If you think you may have a problem with alcohol or drug use, talk to your provider. This includes prescription medicines (such as amphetamines and opioids) and illegal drugs (such as cocaine and methamphetamine). Your provider can help you figure out what type of treatment is best for you.   If you have concerns about domestic violence or intimate  partner violence, there are resources available to you. National Domestic Abuse Hotline 404-159-9348   Protect your skin from too much sun. When you're outdoors from 10 a.m. to 4 p.m., stay in the shade or cover up with clothing and a hat with a wide brim. Wear sunglasses that block UV rays. Even when it's cloudy, put broad-spectrum sunscreen (SPF 30 or higher) on any exposed skin.   See a dentist one or two times a year for checkups and to have your teeth cleaned.   See an eye doctor once per year for an eye exam.   Wear a seat belt in the car.  When  should you call for help?  Watch closely for changes in your health, and be sure to contact your provider if you have any problems or symptoms that concern you.  We will plan to see you back in 12 months for your physical  You will receive a survey after today's visit either digitally by e-mail or paper by USPS mail. Your experiences and feedback matter to [place].  Please respond so we know how we are doing as we provide care for you.  Call us with any questions/concerns/needs.  It is my goal to be available to you for your health concerns.  Thanks for choosing me to be a partner in your healthcare needs!  Charlaine Dalton, FNP-C Family Nurse Practitioner [facility] Phone: [phone]

## 2020-07-25 NOTE — Assessment & Plan Note (Signed)
Annual physical exam without new findings.  Well adult with no acute concerns.  Plan: 1. Obtain health maintenance screenings as above according to age. - Increase physical activity to 30 minutes most days of the week.  - Eat healthy diet high in vegetables and fruits; low in refined carbohydrates. - Screening labs and tests as ordered 2. Return 1 year for annual physical.  

## 2020-07-26 LAB — CBC WITH DIFFERENTIAL/PLATELET
Absolute Monocytes: 352 cells/uL (ref 200–950)
Basophils Absolute: 22 cells/uL (ref 0–200)
Basophils Relative: 0.5 %
Eosinophils Absolute: 79 cells/uL (ref 15–500)
Eosinophils Relative: 1.8 %
HCT: 41.9 % (ref 35.0–45.0)
Hemoglobin: 14 g/dL (ref 11.7–15.5)
Lymphs Abs: 972 cells/uL (ref 850–3900)
MCH: 32.1 pg (ref 27.0–33.0)
MCHC: 33.4 g/dL (ref 32.0–36.0)
MCV: 96.1 fL (ref 80.0–100.0)
MPV: 9.7 fL (ref 7.5–12.5)
Monocytes Relative: 8 %
Neutro Abs: 2974 cells/uL (ref 1500–7800)
Neutrophils Relative %: 67.6 %
Platelets: 186 10*3/uL (ref 140–400)
RBC: 4.36 10*6/uL (ref 3.80–5.10)
RDW: 12 % (ref 11.0–15.0)
Total Lymphocyte: 22.1 %
WBC: 4.4 10*3/uL (ref 3.8–10.8)

## 2020-07-26 LAB — COMPLETE METABOLIC PANEL WITH GFR
AG Ratio: 1.7 (calc) (ref 1.0–2.5)
ALT: 18 U/L (ref 6–29)
AST: 21 U/L (ref 10–30)
Albumin: 4.5 g/dL (ref 3.6–5.1)
Alkaline phosphatase (APISO): 33 U/L (ref 31–125)
BUN: 12 mg/dL (ref 7–25)
CO2: 28 mmol/L (ref 20–32)
Calcium: 9.9 mg/dL (ref 8.6–10.2)
Chloride: 103 mmol/L (ref 98–110)
Creat: 0.76 mg/dL (ref 0.50–1.10)
GFR, Est African American: 113 mL/min/{1.73_m2} (ref >=60)
GFR, Est Non African American: 97 mL/min/{1.73_m2} (ref >=60)
Globulin: 2.7 g/dL (calc) (ref 1.9–3.7)
Glucose, Bld: 92 mg/dL (ref 65–99)
Potassium: 4.5 mmol/L (ref 3.5–5.3)
Sodium: 141 mmol/L (ref 135–146)
Total Bilirubin: 1.2 mg/dL (ref 0.2–1.2)
Total Protein: 7.2 g/dL (ref 6.1–8.1)

## 2020-07-26 LAB — LIPID PANEL
Cholesterol: 197 mg/dL (ref <200)
HDL: 92 mg/dL (ref >=50)
LDL Cholesterol (Calc): 91 mg/dL (calc)
Non-HDL Cholesterol (Calc): 105 mg/dL (calc) (ref <130)
Total CHOL/HDL Ratio: 2.1 (calc) (ref <5.0)
Triglycerides: 59 mg/dL (ref <150)

## 2020-07-26 LAB — URINALYSIS, MICROSCOPIC ONLY
Bacteria, UA: NONE SEEN /HPF
Hyaline Cast: NONE SEEN /LPF
Squamous Epithelial / HPF: NONE SEEN /HPF (ref <=5)
WBC, UA: NONE SEEN /HPF (ref 0–5)

## 2020-07-26 LAB — TSH+FREE T4: TSH W/REFLEX TO FT4: 1.75 mIU/L

## 2020-12-18 ENCOUNTER — Encounter: Payer: Self-pay | Admitting: Obstetrics and Gynecology

## 2020-12-18 ENCOUNTER — Ambulatory Visit (INDEPENDENT_AMBULATORY_CARE_PROVIDER_SITE_OTHER): Payer: BC Managed Care – PPO | Admitting: Obstetrics and Gynecology

## 2020-12-18 ENCOUNTER — Other Ambulatory Visit: Payer: Self-pay

## 2020-12-18 VITALS — BP 120/90 | Ht 67.0 in | Wt 149.0 lb

## 2020-12-18 DIAGNOSIS — Z30431 Encounter for routine checking of intrauterine contraceptive device: Secondary | ICD-10-CM | POA: Diagnosis not present

## 2020-12-18 DIAGNOSIS — Z01419 Encounter for gynecological examination (general) (routine) without abnormal findings: Secondary | ICD-10-CM | POA: Diagnosis not present

## 2020-12-18 DIAGNOSIS — Z1231 Encounter for screening mammogram for malignant neoplasm of breast: Secondary | ICD-10-CM

## 2020-12-18 NOTE — Progress Notes (Signed)
PCP:  Verl Bangs, FNP   Chief Complaint  Patient presents with  . Gynecologic Exam    No concerns     HPI:      Tanya Gillespie is a 45 y.o. [id number] who LMP was No LMP recorded. (Menstrual status: IUD)., presents today for her annual examination.  Her menses are infrequent, light spotting with IUD. Dysmenorrhea none. No vasomotor sx.  Sex activity: single partner, contraception - IUD. Mirena placed 08/19/16. Strings not visible at 4 wk f/u appt, but IUD placement confirmed with u/s in past.  Last Pap: 07/26/17  Results were: no abnormalities /neg HPV DNA.  Hx of STDs: none  Last mammo: 12/08/19 Results were normal, repeat in 12 months.  There is a FH of breast cancer in her MGM and pat grt aunt, genetic testing not indicated but done in the past when we thought MGM was younger with breast cancer dx. Pt is BRCA/BART neg 2014.There is no FH of ovarian cancer. The patient does do self-breast exams.  Tobacco use: The patient denies current or previous tobacco use. Alcohol use: social drinker No drug use.  Exercise: somewhat active  She does get adequate calcium and Vitamin D in her diet. Labs with PCP.   Past Medical History:  Diagnosis Date  . ASCUS of cervix with negative high risk HPV 05/2011  . Family history of breast cancer 2014   IBIS=17.9%  . Genetic testing of female 06/2013   BRCA/BART NEG  . History of Papanicolaou smear of cervix 06/25/2015   NEG  . Migraine    DR. MEAD    Past Surgical History:  Procedure Laterality Date  . AUGMENTATION MAMMAPLASTY Bilateral 2015  . BREAST ENHANCEMENT SURGERY    . CESAREAN SECTION  09/21/2006   OLIGO  . CESAREAN SECTION  07/11/2009   PREV C/S  . INTRAUTERINE DEVICE (IUD) INSERTION  08/2011   MIRENA    Family History  Problem Relation Age of Onset  . Hypertension Father   . Migraines Father   . Crohn's disease Brother   . Breast cancer Maternal Grandmother 10       DECEASED AGE 80  . Cancer Maternal  Grandfather 63       MELANOMA OF SKIN  . Heart disease Paternal Grandfather        MI  . Breast cancer Other        MASTECTOMY  . Cancer Mother 34       thyroid ca - taken out  . Crohn's disease Mother   . Alzheimer's disease Paternal Grandmother     Social History   Socioeconomic History  . Marital status: Married    Spouse name: Not on file  . Number of children: 2  . Years of education: 16  . Highest education level: Bachelor's degree (e.g., BA, AB, BS)  Occupational History  . Occupation: TEACHER  Tobacco Use  . Smoking status: Former Smoker    Packs/day: 0.50    Years: 2.00    Pack years: 1.00    Types: Cigarettes  . Smokeless tobacco: Never Used  Vaping Use  . Vaping Use: Never used  Substance and Sexual Activity  . Alcohol use: Yes    Alcohol/week: 2.0 standard drinks    Types: 2 Cans of beer per week    Comment: OCC  . Drug use: No  . Sexual activity: Yes    Birth control/protection: I.U.D.    Comment: Mirena  Other Topics Concern  .  Not on file  Social History Narrative  . Not on file   Social Determinants of Health   Financial Resource Strain: Not on file  Food Insecurity: Not on file  Transportation Needs: Not on file  Physical Activity: Not on file  Stress: Not on file  Social Connections: Not on file  Intimate Partner Violence: Not on file    Current Meds  Medication Sig  . Cholecalciferol (PA VITAMIN D-3 GUMMY PO) Take by mouth.  . levonorgestrel (MIRENA) 20 MCG/24HR IUD 1 each by Intrauterine route once.     ROS:  Review of Systems  Constitutional: Negative for fatigue, fever and unexpected weight change.  Respiratory: Negative for cough, shortness of breath and wheezing.   Cardiovascular: Negative for chest pain, palpitations and leg swelling.  Gastrointestinal: Negative for blood in stool, constipation, diarrhea, nausea and vomiting.  Endocrine: Negative for cold intolerance, heat intolerance and polyuria.  Genitourinary:  Negative for dyspareunia, dysuria, flank pain, frequency, genital sores, hematuria, menstrual problem, pelvic pain, urgency, vaginal bleeding, vaginal discharge and vaginal pain.  Musculoskeletal: Negative for back pain, joint swelling and myalgias.  Skin: Negative for rash.  Neurological: Negative for dizziness, syncope, light-headedness, numbness and headaches.  Hematological: Negative for adenopathy.  Psychiatric/Behavioral: Negative for agitation, confusion, sleep disturbance and suicidal ideas. The patient is not nervous/anxious.      Objective: BP 120/90   Ht '5\' 7"'  (1.702 m)   Wt 149 lb (67.6 kg)   BMI 23.34 kg/m    Physical Exam Constitutional:      Appearance: She is well-developed.  Genitourinary:     Vulva normal.     Genitourinary Comments: IUD STRINGS NOT VISIBLE/PALPABLE     Right Labia: No rash, tenderness or lesions.    Left Labia: No tenderness, lesions or rash.    No vaginal discharge, erythema or tenderness.      Right Adnexa: not tender and no mass present.    Left Adnexa: not tender and no mass present.    No cervical motion tenderness, friability or polyp.     Uterus is not enlarged or tender.  Breasts:     Right: No mass, nipple discharge, skin change or tenderness.     Left: No mass, nipple discharge, skin change or tenderness.    Neck:     Thyroid: No thyromegaly.  Cardiovascular:     Rate and Rhythm: Normal rate and regular rhythm.     Heart sounds: Normal heart sounds. No murmur heard.   Pulmonary:     Effort: Pulmonary effort is normal.     Breath sounds: Normal breath sounds.  Abdominal:     Palpations: Abdomen is soft.     Tenderness: There is no abdominal tenderness. There is no guarding or rebound.  Musculoskeletal:        General: Normal range of motion.     Cervical back: Normal range of motion.  Lymphadenopathy:     Cervical: No cervical adenopathy.  Neurological:     General: No focal deficit present.     Mental Status: She is  alert and oriented to person, place, and time.     Cranial Nerves: No cranial nerve deficit.  Skin:    General: Skin is warm and dry.  Psychiatric:        Mood and Affect: Mood normal.        Behavior: Behavior normal.        Thought Content: Thought content normal.        Judgment:  Judgment normal.  Vitals reviewed.     Assessment/Plan: Encounter for annual routine gynecological examination  Encounter for routine checking of intrauterine contraceptive device (IUD)--IUD strings not in cx os but placement confirmed on u/s in past. Due for removal 10/24.   Encounter for screening mammogram for malignant neoplasm of breast - Plan: MM 3D SCREEN BREAST BILATERAL; pt to sched mammo         GYN counsel breast self exam, mammography screening, adequate intake of calcium and vitamin D, diet and exercise     F/U  Return in about 1 year (around 12/18/2021).  Koriana Stepien B. Senetra Dillin, PA-C 12/18/2020 3:24 PM

## 2020-12-18 NOTE — Patient Instructions (Addendum)
I value your feedback and you entrusting us with your care. If you get a [facility] patient survey, I would appreciate you taking the time to let us know about your experience today. Thank you!  [facility]: [phone]      

## 2021-01-22 ENCOUNTER — Other Ambulatory Visit: Payer: Self-pay

## 2021-01-22 ENCOUNTER — Ambulatory Visit
Admission: RE | Admit: 2021-01-22 | Discharge: 2021-01-22 | Disposition: A | Discharge status: Home / Self Care | Payer: BC Managed Care – PPO | Source: Ambulatory Visit | Attending: Obstetrics and Gynecology | Admitting: Obstetrics and Gynecology

## 2021-01-22 DIAGNOSIS — Z1231 Encounter for screening mammogram for malignant neoplasm of breast: Secondary | ICD-10-CM | POA: Insufficient documentation

## 2021-05-08 IMAGING — MG DIGITAL SCREENING BREAST BILAT IMPLANT W/ TOMO W/ CAD
9 of 14 series · 9 of 34 positions shown · non-contrast
Comparison: Previous exam(s).

CLINICAL DATA: Screening.

EXAM:
DIGITAL SCREENING BILATERAL MAMMOGRAM WITH IMPLANTS, CAD AND TOMO
The patient has retroglandular implants. Standard and implant
displaced views were performed.

[R CC]
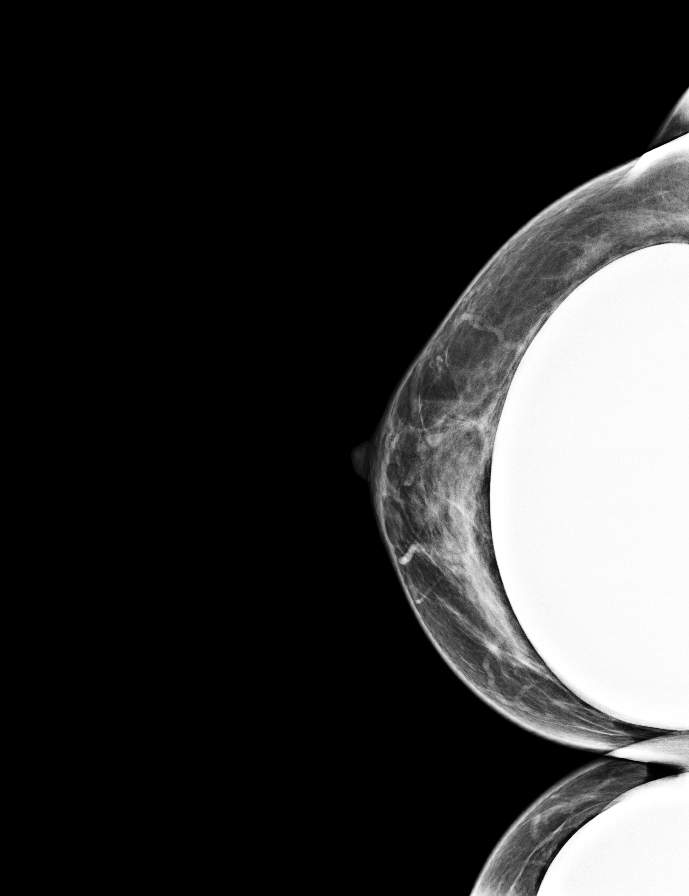

[L MLO]
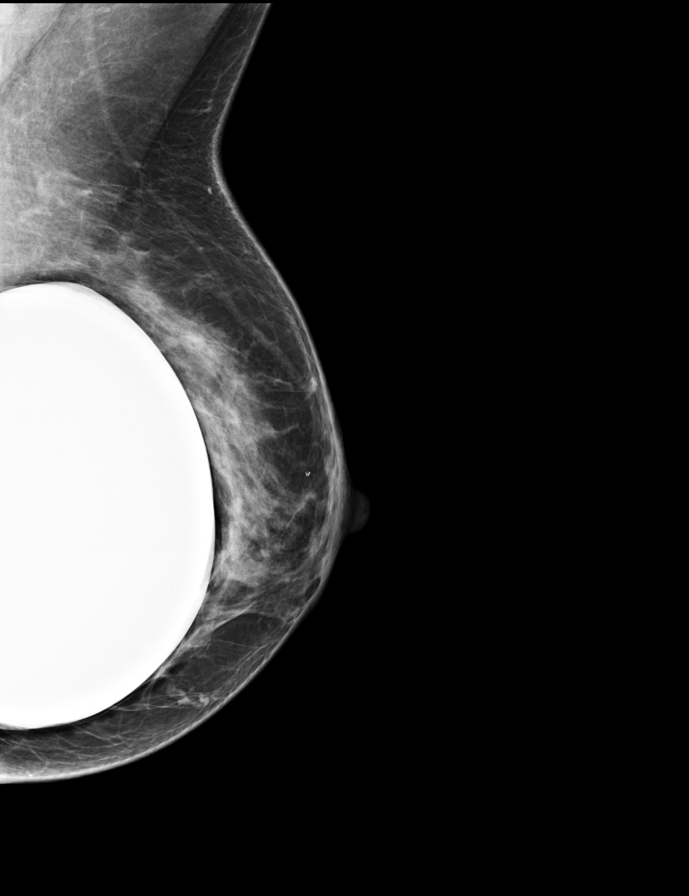

[R MLO]
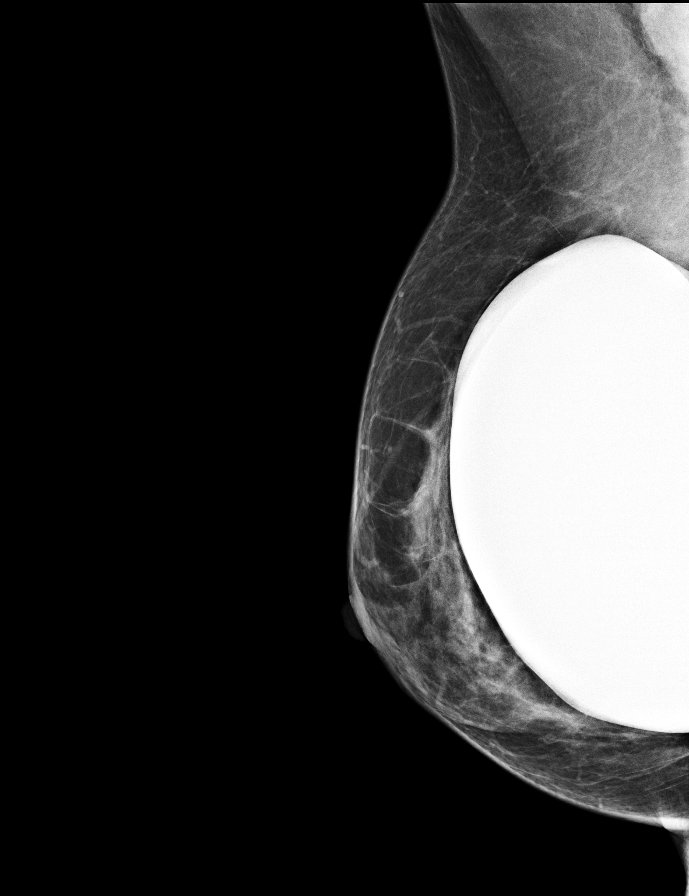

[L CC]
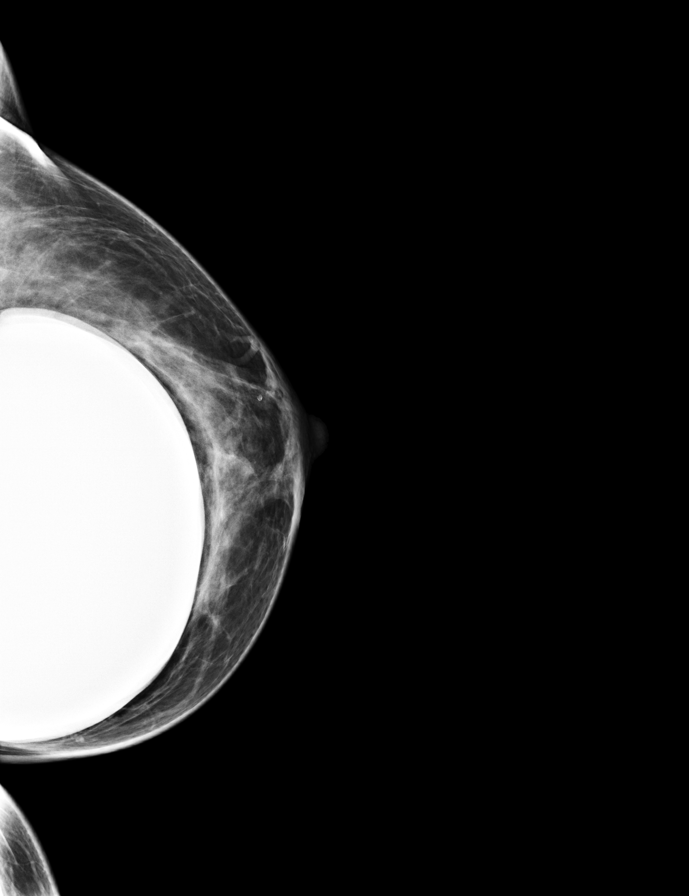

[R MLO synth-2D (1 of 2)]
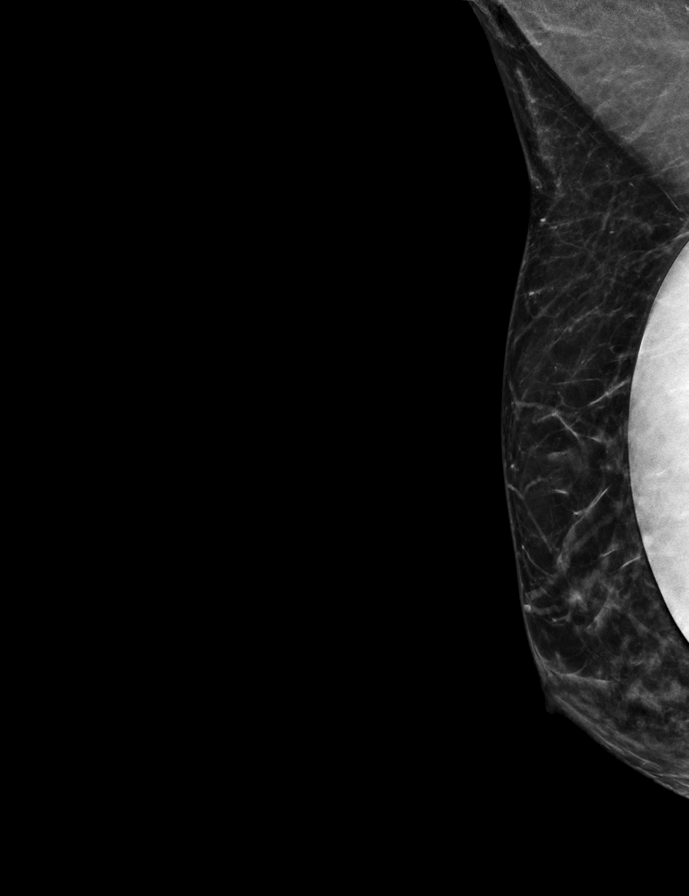

[L CC synth-2D]
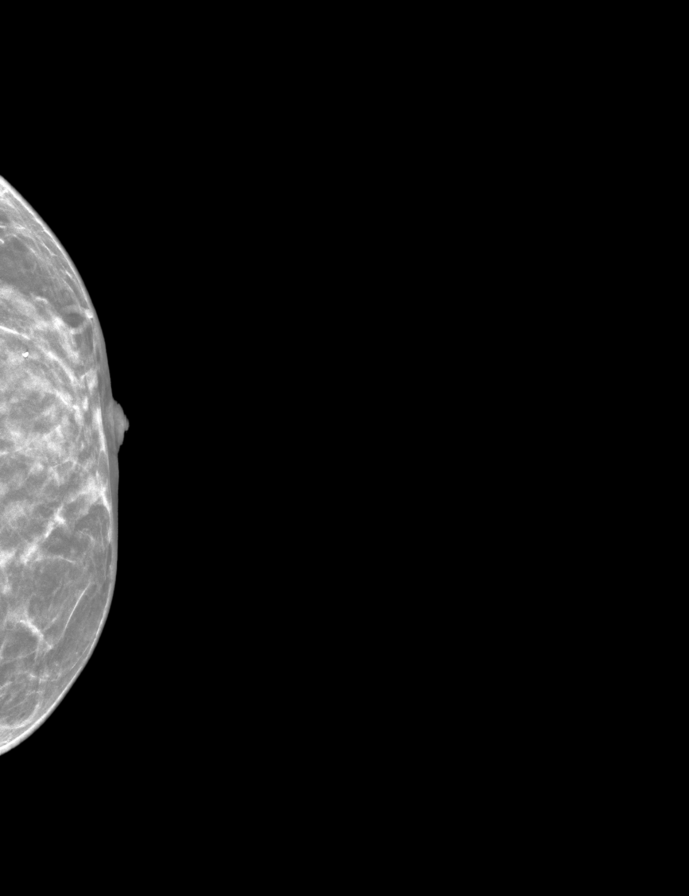

[R MLO synth-2D (2 of 2)]
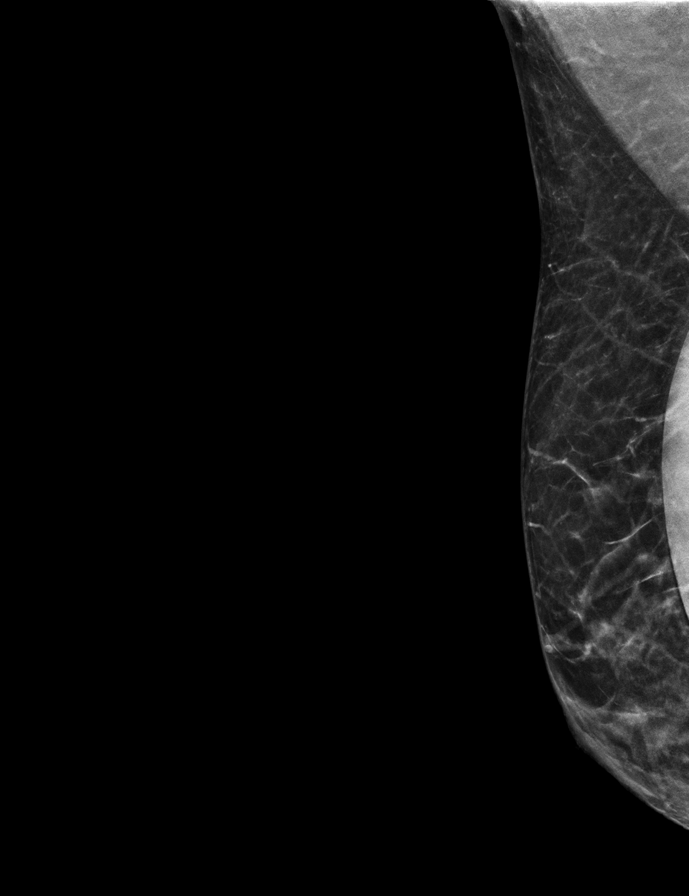

[R CC synth-2D]
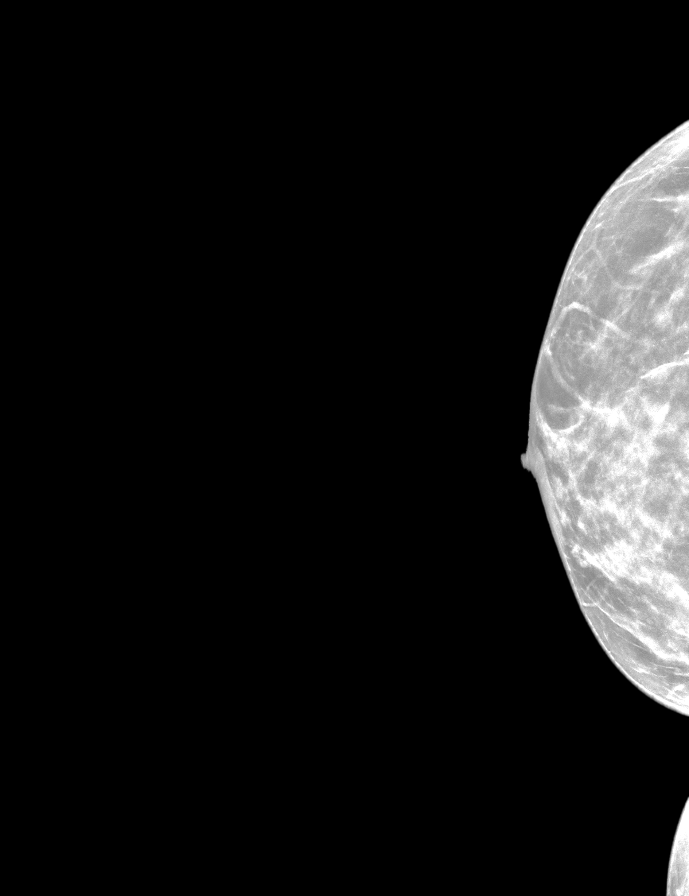

[L MLO synth-2D]
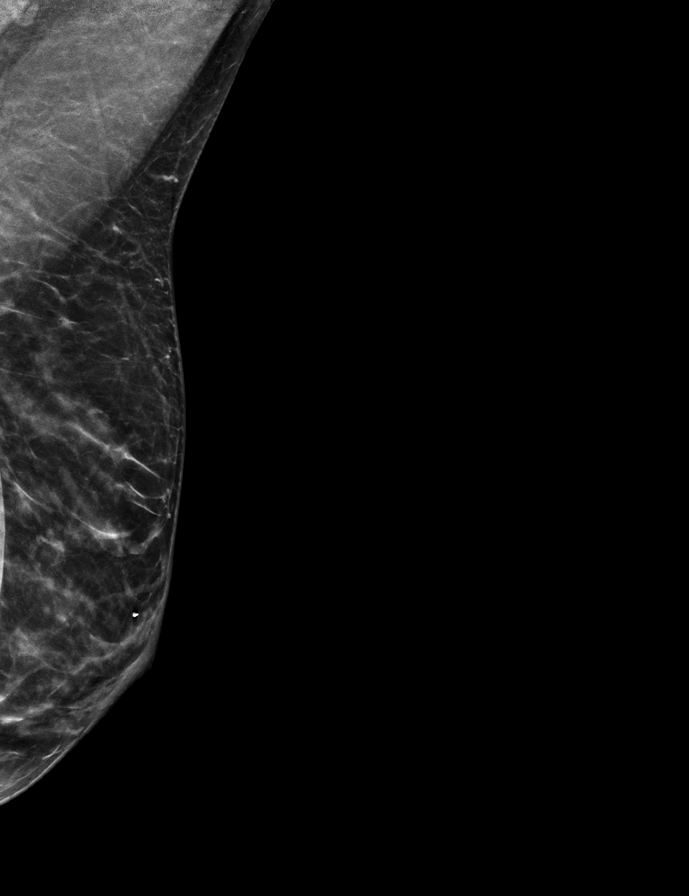

[9 of 34 positions shown; findings below may reference images not displayed]

ACR Breast Density Category c: The breast tissue is heterogeneously
dense, which may obscure small masses.
FINDINGS: There are no findings suspicious for malignancy. Images were
processed with CAD.
IMPRESSION: No mammographic evidence of malignancy. A result letter of this
screening mammogram will be mailed directly to the patient.

RECOMMENDATION:
Screening mammogram in one year. (Code:57-0-9UK)

BI-RADS CATEGORY  1:  Negative.

## 2021-07-28 ENCOUNTER — Other Ambulatory Visit: Payer: Self-pay

## 2021-07-28 ENCOUNTER — Encounter: Payer: Self-pay | Admitting: Internal Medicine

## 2021-07-28 ENCOUNTER — Ambulatory Visit (INDEPENDENT_AMBULATORY_CARE_PROVIDER_SITE_OTHER): Payer: BC Managed Care – PPO | Admitting: Internal Medicine

## 2021-07-28 VITALS — BP 110/67 | HR 77 | Temp 97.1°F | Resp 17 | Ht 67.0 in | Wt 143.6 lb

## 2021-07-28 DIAGNOSIS — Z0000 Encounter for general adult medical examination without abnormal findings: Secondary | ICD-10-CM

## 2021-07-28 NOTE — Progress Notes (Signed)
Subjective:    Patient ID: Tanya Gillespie, female    DOB: [birth year], 45 y.o.   MRN: [id number]  HPI  Patient presents the clinic today for her annual exam.  Flu: 07/2019 Tetanus: 01/2018 COVID: never Pap smear: 07/2017 Mammogram: 12/2020 Vision screening: annually Dentist: biannually  Diet: She does eat meat. She consumes fruits and veggies. She does eat fried foods. She drinks mostly water, tea and soda. Exercise: None  Review of Systems     Past Medical History:  Diagnosis Date   ASCUS of cervix with negative high risk HPV 05/2011   Family history of breast cancer 2014   IBIS=17.9%   Genetic testing of female 06/2013   BRCA/BART NEG   History of Papanicolaou smear of cervix 06/25/2015   NEG   Migraine    DR. MEAD    Current Outpatient Medications  Medication Sig Dispense Refill   Cholecalciferol (PA VITAMIN D-3 GUMMY PO) Take by mouth.     levonorgestrel (MIRENA) 20 MCG/24HR IUD 1 each by Intrauterine route once.     No current facility-administered medications for this visit.    Allergies  Allergen Reactions   Penicillins Hives    Family History  Problem Relation Age of Onset   Hypertension Father    Migraines Father    Crohn's disease Brother    Breast cancer Maternal Grandmother 41       DECEASED AGE 43   Cancer Maternal Grandfather 77       MELANOMA OF SKIN   Heart disease Paternal Grandfather        MI   Breast cancer Other        MASTECTOMY   Cancer Mother 30       thyroid ca - taken out   Crohn's disease Mother    Alzheimer's disease Paternal Grandmother     Social History   Socioeconomic History   Marital status: Married    Spouse name: Not on file   Number of children: 2   Years of education: 16   Highest education level: Bachelor's degree (e.g., BA, AB, BS)  Occupational History   Occupation: TEACHER  Tobacco Use   Smoking status: Former    Packs/day: 0.50    Years: 2.00    Pack years: 1.00    Types: Cigarettes   Smokeless  tobacco: Never  Vaping Use   Vaping Use: Never used  Substance and Sexual Activity   Alcohol use: Yes    Alcohol/week: 2.0 standard drinks    Types: 2 Cans of beer per week    Comment: OCC   Drug use: No   Sexual activity: Yes    Birth control/protection: I.U.D.    Comment: Mirena  Other Topics Concern   Not on file  Social History Narrative   Not on file   Social Determinants of Health   Financial Resource Strain: Not on file  Food Insecurity: Not on file  Transportation Needs: Not on file  Physical Activity: Not on file  Stress: Not on file  Social Connections: Not on file  Intimate Partner Violence: Not on file     Constitutional: Denies fever, malaise, fatigue, headache or abrupt weight changes.  HEENT: Denies eye pain, eye redness, ear pain, ringing in the ears, wax buildup, runny nose, nasal congestion, bloody nose, or sore throat. Respiratory: Denies difficulty breathing, shortness of breath, cough or sputum production.   Cardiovascular: Denies chest pain, chest tightness, palpitations or swelling in the hands or feet.  Gastrointestinal:  Denies abdominal pain, bloating, constipation, diarrhea or blood in the stool.  GU: Denies urgency, frequency, pain with urination, burning sensation, blood in urine, odor or discharge. Musculoskeletal: Denies decrease in range of motion, difficulty with gait, muscle pain or joint pain and swelling.  Skin: Pt reports dry skin. Denies redness, rashes, lesions or ulcercations.  Neurological: Denies dizziness, difficulty with memory, difficulty with speech or problems with balance and coordination.  Psych: Denies anxiety, depression, SI/HI.  No other specific complaints in a complete review of systems (except as listed in HPI above).  Objective:   Physical Exam  BP 110/67 (BP Location: Left Arm, Patient Position: Sitting, Cuff Size: Normal)   Pulse 77   Temp (!) 97.1 F (36.2 C) (Temporal)   Resp 17   Ht '5\' 7"'  (1.702 m)   Wt 143  lb 9.6 oz (65.1 kg)   SpO2 100%   BMI 22.49 kg/m  There were no vitals taken for this visit. Wt Readings from Last 3 Encounters:  12/18/20 149 lb (67.6 kg)  07/25/20 142 lb 9.6 oz (64.7 kg)  11/06/19 145 lb (65.8 kg)    General: Appears her stated age, well developed, well nourished in NAD. Skin: Warm, dry and intact. No rashes noted. HEENT: Head: normal shape and size; Eyes: sclera white and EOMs intact; Neck:  Neck supple, trachea midline. No masses, lumps or thyromegaly present.  Cardiovascular: Normal rate and rhythm. S1,S2 noted.  No murmur, rubs or gallops noted. No JVD or BLE edema.  Pulmonary/Chest: Normal effort and positive vesicular breath sounds. No respiratory distress. No wheezes, rales or ronchi noted.  Abdomen: Soft and nontender. Normal bowel sounds. No distention or masses noted. Liver, spleen and kidneys non palpable. Musculoskeletal: Strength 5/5 BUE/BLE.  No difficulty with gait.  Neurological: Alert and oriented. Cranial nerves II-XII grossly intact. Coordination normal.  Psychiatric: Mood and affect normal. Behavior is normal. Judgment and thought content normal.    BMET    Component Value Date/Time   NA 141 07/25/2020 0902   K 4.5 07/25/2020 0902   CL 103 07/25/2020 0902   CO2 28 07/25/2020 0902   GLUCOSE 92 07/25/2020 0902   BUN 12 07/25/2020 0902   CREATININE 0.76 07/25/2020 0902   CALCIUM 9.9 07/25/2020 0902   GFRNONAA 97 07/25/2020 0902   GFRAA 113 07/25/2020 0902    Lipid Panel     Component Value Date/Time   CHOL 197 07/25/2020 0902   TRIG 59 07/25/2020 0902   HDL 92 07/25/2020 0902   CHOLHDL 2.1 07/25/2020 0902   LDLCALC 91 07/25/2020 0902    CBC    Component Value Date/Time   WBC 4.4 07/25/2020 0902   RBC 4.36 07/25/2020 0902   HGB 14.0 07/25/2020 0902   HCT 41.9 07/25/2020 0902   PLT 186 07/25/2020 0902   MCV 96.1 07/25/2020 0902   MCH 32.1 07/25/2020 0902   MCHC 33.4 07/25/2020 0902   RDW 12.0 07/25/2020 0902   LYMPHSABS 972  07/25/2020 0902   EOSABS 79 07/25/2020 0902   BASOSABS 22 07/25/2020 0902    Hgb A1C Lab Results  Component Value Date   HGBA1C 4.6 04/05/2018           Assessment & Plan:   Preventative Health Maintenance:  She declines flu shot Tetanus UTD Encouraged her to get her COVID-vaccine Pap smear due 2023 Mammogram UTD Encouraged her to consume a balanced diet and exercise regimen Advised her to see an eye doctor and dentist annually She declines  lab work today  RTC in 1 year, sooner if needed Webb Silversmith, NP This visit occurred during the SARS-CoV-2 public health emergency.  Safety protocols were in place, including screening questions prior to the visit, additional usage of staff PPE, and extensive cleaning of exam room while observing appropriate contact time as indicated for disinfecting solutions.    Webb Silversmith, NP This visit occurred during the SARS-CoV-2 public health emergency.  Safety protocols were in place, including screening questions prior to the visit, additional usage of staff PPE, and extensive cleaning of exam room while observing appropriate contact time as indicated for disinfecting solutions.

## 2021-07-28 NOTE — Patient Instructions (Signed)
Health Maintenance, Female Adopting a healthy lifestyle and getting preventive care are important in promoting health and wellness. Ask your health care provider about: The right schedule for you to have regular tests and exams. Things you can do on your own to prevent diseases and keep yourself healthy. What should I know about diet, weight, and exercise? Eat a healthy diet  Eat a diet that includes plenty of vegetables, fruits, low-fat dairy products, and lean protein. Do not eat a lot of foods that are high in solid fats, added sugars, or sodium. Maintain a healthy weight Body mass index (BMI) is used to identify weight problems. It estimates body fat based on height and weight. Your health care provider can help determine your BMI and help you achieve or maintain a healthy weight. Get regular exercise Get regular exercise. This is one of the most important things you can do for your health. Most adults should: Exercise for at least 150 minutes each week. The exercise should increase your heart rate and make you sweat (moderate-intensity exercise). Do strengthening exercises at least twice a week. This is in addition to the moderate-intensity exercise. Spend less time sitting. Even light physical activity can be beneficial. Watch cholesterol and blood lipids Have your blood tested for lipids and cholesterol at 43 years of age, then have this test every 5 years. Have your cholesterol levels checked more often if: Your lipid or cholesterol levels are high. You are older than 43 years of age. You are at high risk for heart disease. What should I know about cancer screening? Depending on your health history and family history, you may need to have cancer screening at various ages. This may include screening for: Breast cancer. Cervical cancer. Colorectal cancer. Skin cancer. Lung cancer. What should I know about heart disease, diabetes, and high blood pressure? Blood pressure and heart  disease High blood pressure causes heart disease and increases the risk of stroke. This is more likely to develop in people who have high blood pressure readings, are of African descent, or are overweight. Have your blood pressure checked: Every 3-5 years if you are 18-39 years of age. Every year if you are 40 years old or older. Diabetes Have regular diabetes screenings. This checks your fasting blood sugar level. Have the screening done: Once every three years after age 40 if you are at a normal weight and have a low risk for diabetes. More often and at a younger age if you are overweight or have a high risk for diabetes. What should I know about preventing infection? Hepatitis B If you have a higher risk for hepatitis B, you should be screened for this virus. Talk with your health care provider to find out if you are at risk for hepatitis B infection. Hepatitis C Testing is recommended for: Everyone born from 1945 through 1965. Anyone with known risk factors for hepatitis C. Sexually transmitted infections (STIs) Get screened for STIs, including gonorrhea and chlamydia, if: You are sexually active and are younger than 43 years of age. You are older than 43 years of age and your health care provider tells you that you are at risk for this type of infection. Your sexual activity has changed since you were last screened, and you are at increased risk for chlamydia or gonorrhea. Ask your health care provider if you are at risk. Ask your health care provider about whether you are at high risk for HIV. Your health care provider may recommend a prescription medicine   to help prevent HIV infection. If you choose to take medicine to prevent HIV, you should first get tested for HIV. You should then be tested every 3 months for as long as you are taking the medicine. Pregnancy If you are about to stop having your period (premenopausal) and you may become pregnant, seek counseling before you get  pregnant. Take 400 to 800 micrograms (mcg) of folic acid every day if you become pregnant. Ask for birth control (contraception) if you want to prevent pregnancy. Osteoporosis and menopause Osteoporosis is a disease in which the bones lose minerals and strength with aging. This can result in bone fractures. If you are 65 years old or older, or if you are at risk for osteoporosis and fractures, ask your health care provider if you should: Be screened for bone loss. Take a calcium or vitamin D supplement to lower your risk of fractures. Be given hormone replacement therapy (HRT) to treat symptoms of menopause. Follow these instructions at home: Lifestyle Do not use any products that contain nicotine or tobacco, such as cigarettes, e-cigarettes, and chewing tobacco. If you need help quitting, ask your health care provider. Do not use street drugs. Do not share needles. Ask your health care provider for help if you need support or information about quitting drugs. Alcohol use Do not drink alcohol if: Your health care provider tells you not to drink. You are pregnant, may be pregnant, or are planning to become pregnant. If you drink alcohol: Limit how much you use to 0-1 drink a day. Limit intake if you are breastfeeding. Be aware of how much alcohol is in your drink. In the [place] one drink equals one 12 oz bottle of beer (355 mL), one 5 oz glass of wine (148 mL), or one 1 oz glass of hard liquor (44 mL). General instructions Schedule regular health, dental, and eye exams. Stay current with your vaccines. Tell your health care provider if: You often feel depressed. You have ever been abused or do not feel safe at home. Summary Adopting a healthy lifestyle and getting preventive care are important in promoting health and wellness. Follow your health care provider's instructions about healthy diet, exercising, and getting tested or screened for diseases. Follow your health care provider's  instructions on monitoring your cholesterol and blood pressure. This information is not intended to replace advice given to you by your health care provider. Make sure you discuss any questions you have with your health care provider. Document Revised: 12/27/2020 Document Reviewed: 10/12/2018 Elsevier Patient Education  2022 Elsevier Inc[employer]  

## 2021-12-10 ENCOUNTER — Other Ambulatory Visit: Payer: Self-pay | Admitting: Obstetrics and Gynecology

## 2021-12-10 DIAGNOSIS — Z1231 Encounter for screening mammogram for malignant neoplasm of breast: Secondary | ICD-10-CM

## 2022-01-05 NOTE — Progress Notes (Signed)
? ?PCP:  Jearld Fenton, NP ? ? ?Chief Complaint  ?Patient presents with  ? Gynecologic Exam  ?  No concerns  ? ? ? ?HPI: ?     [name] is a 46 y.o. [id number] who LMP was No LMP recorded. (Menstrual status: IUD)., presents today for her annual examination.  Her menses are absent now with IUD. Dysmenorrhea none. No vasomotor sx. ? ?Sex activity: single partner, contraception - IUD. Mirena placed 08/19/16. Strings not visible at 4 wk f/u appt, but IUD placement confirmed with u/s in past. ? ?Last Pap: 07/26/17  Results were: no abnormalities /neg HPV DNA.  ?Hx of STDs: none ? ?Last mammo: 01/22/21 Results were normal, repeat in 12 months. Has appt 3/23 ?There is a FH of breast cancer in her MGM and pat grt aunt, genetic testing not indicated but done in the past when we thought MGM was younger with breast cancer dx. Pt is BRCA/BART neg 2014.There is no FH of ovarian cancer. The patient does self-breast exams. ? ?Tobacco use: The patient denies current or previous tobacco use. ?Alcohol use: social drinker ?No drug use.  ?Exercise: somewhat active ? ?She does get adequate calcium and Vitamin D in her diet. ?Labs with PCP.  ? ?Past Medical History:  ?Diagnosis Date  ? ASCUS of cervix with negative high risk HPV 05/2011  ? Family history of breast cancer 2014  ? IBIS=17.9%  ? Genetic testing of female 06/2013  ? BRCA/BART NEG  ? History of Papanicolaou smear of cervix 06/25/2015  ? NEG  ? Migraine   ? DR. MEAD  ? ? ?Past Surgical History:  ?Procedure Laterality Date  ? AUGMENTATION MAMMAPLASTY Bilateral 2015  ? BREAST ENHANCEMENT SURGERY    ? CESAREAN SECTION  09/21/2006  ? OLIGO  ? CESAREAN SECTION  07/11/2009  ? PREV C/S  ? INTRAUTERINE DEVICE (IUD) INSERTION  08/2011  ? MIRENA  ? ? ?Family History  ?Problem Relation Age of Onset  ? Hypertension Father   ? Migraines Father   ? Crohn's disease Brother   ? Breast cancer Maternal Grandmother 32  ?     DECEASED AGE 70  ? Cancer Maternal Grandfather 36  ?      MELANOMA OF SKIN  ? Heart disease Paternal Grandfather   ?     MI  ? Breast cancer Other   ?     MASTECTOMY  ? Cancer Mother 61  ?     thyroid ca - taken out  ? Crohn's disease Mother   ? Alzheimer's disease Paternal Grandmother   ? ? ?Social History  ? ?Socioeconomic History  ? Marital status: Married  ?  Spouse name: Not on file  ? Number of children: 2  ? Years of education: 33  ? Highest education level: Bachelor's degree (e.g., BA, AB, BS)  ?Occupational History  ? Occupation: TEACHER  ?Tobacco Use  ? Smoking status: Former  ?  Packs/day: 0.50  ?  Years: 2.00  ?  Pack years: 1.00  ?  Types: Cigarettes  ? Smokeless tobacco: Never  ?Vaping Use  ? Vaping Use: Never used  ?Substance and Sexual Activity  ? Alcohol use: Yes  ?  Alcohol/week: 2.0 standard drinks  ?  Types: 2 Cans of beer per week  ?  Comment: OCC  ? Drug use: No  ? Sexual activity: Yes  ?  Birth control/protection: I.U.D.  ?  Comment: Mirena  ?Other Topics Concern  ? Not on  file  ?Social History Narrative  ? Not on file  ? ?Social Determinants of Health  ? ?Financial Resource Strain: Not on file  ?Food Insecurity: Not on file  ?Transportation Needs: Not on file  ?Physical Activity: Not on file  ?Stress: Not on file  ?Social Connections: Not on file  ?Intimate Partner Violence: Not on file  ? ? ?Current Meds  ?Medication Sig  ? Cholecalciferol (PA VITAMIN D-3 GUMMY PO) Take by mouth.  ? levonorgestrel (MIRENA) 20 MCG/24HR IUD 1 each by Intrauterine route once.  ? ? ? ?ROS: ? ?Review of Systems  ?Constitutional:  Negative for fatigue, fever and unexpected weight change.  ?Respiratory:  Negative for cough, shortness of breath and wheezing.   ?Cardiovascular:  Negative for chest pain, palpitations and leg swelling.  ?Gastrointestinal:  Negative for blood in stool, constipation, diarrhea, nausea and vomiting.  ?Endocrine: Negative for cold intolerance, heat intolerance and polyuria.  ?Genitourinary:  Negative for dyspareunia, dysuria, flank pain,  frequency, genital sores, hematuria, menstrual problem, pelvic pain, urgency, vaginal bleeding, vaginal discharge and vaginal pain.  ?Musculoskeletal:  Negative for back pain, joint swelling and myalgias.  ?Skin:  Negative for rash.  ?Neurological:  Negative for dizziness, syncope, light-headedness, numbness and headaches.  ?Hematological:  Negative for adenopathy.  ?Psychiatric/Behavioral:  Negative for agitation, confusion, sleep disturbance and suicidal ideas. The patient is not nervous/anxious.   ? ? ?Objective: ?BP 106/60   Ht '5\' 7"'  (1.702 m)   Wt 151 lb (68.5 kg)   BMI 23.65 kg/m?  ? ? ?Physical Exam ?Constitutional:   ?   Appearance: She is well-developed.  ?Genitourinary:  ?   Vulva normal.  ?   Genitourinary Comments: IUD STRINGS NOT VISIBLE/PALPABLE  ?   Right Labia: No rash, tenderness or lesions. ?   Left Labia: No tenderness, lesions or rash. ?   No vaginal discharge, erythema or tenderness.  ? ?   Right Adnexa: not tender and no mass present. ?   Left Adnexa: not tender and no mass present. ?   No cervical motion tenderness, friability or polyp.  ?   Uterus is not enlarged or tender.  ?Breasts: ?   Right: No mass, nipple discharge, skin change or tenderness.  ?   Left: No mass, nipple discharge, skin change or tenderness.  ?Neck:  ?   Thyroid: No thyromegaly.  ?Cardiovascular:  ?   Rate and Rhythm: Normal rate and regular rhythm.  ?   Heart sounds: Normal heart sounds. No murmur heard. ?Pulmonary:  ?   Effort: Pulmonary effort is normal.  ?   Breath sounds: Normal breath sounds.  ?Abdominal:  ?   Palpations: Abdomen is soft.  ?   Tenderness: There is no abdominal tenderness. There is no guarding or rebound.  ?Musculoskeletal:     ?   General: Normal range of motion.  ?   Cervical back: Normal range of motion.  ?Lymphadenopathy:  ?   Cervical: No cervical adenopathy.  ?Neurological:  ?   General: No focal deficit present.  ?   Mental Status: She is alert and oriented to person, place, and time.  ?    Cranial Nerves: No cranial nerve deficit.  ?Skin: ?   General: Skin is warm and dry.  ?Psychiatric:     ?   Mood and Affect: Mood normal.     ?   Behavior: Behavior normal.     ?   Thought Content: Thought content normal.     ?  Judgment: Judgment normal.  ?Vitals reviewed.  ? ? ?Assessment/Plan: ?Encounter for annual routine gynecological examination ? ?Cervical cancer screening - Plan: Cytology - PAP ? ?Screening for HPV (human papillomavirus) - Plan: Cytology - PAP ? ?Encounter for routine checking of intrauterine contraceptive device (IUD); has 8 yr indication now; IUD strings not in cx os ? ?Encounter for screening mammogram for malignant neoplasm of breast; pt has mammo appt ? ?       ?GYN counsel breast self exam, mammography screening, adequate intake of calcium and vitamin D, diet and exercise ? ? ?  F/U ? Return in about 1 year (around 01/07/2023). ? ?Menaal Russum B. Lacretia Tindall, PA-C ?01/06/2022 ?11:08 AM ?

## 2022-01-06 ENCOUNTER — Other Ambulatory Visit (HOSPITAL_COMMUNITY)
Admission: RE | Admit: 2022-01-06 | Discharge: 2022-01-06 | Disposition: A | Discharge status: Home / Self Care | Payer: BC Managed Care – PPO | Source: Ambulatory Visit | Attending: Obstetrics and Gynecology | Admitting: Obstetrics and Gynecology

## 2022-01-06 ENCOUNTER — Other Ambulatory Visit: Payer: Self-pay

## 2022-01-06 ENCOUNTER — Encounter: Payer: Self-pay | Admitting: Obstetrics and Gynecology

## 2022-01-06 ENCOUNTER — Ambulatory Visit (INDEPENDENT_AMBULATORY_CARE_PROVIDER_SITE_OTHER): Payer: BC Managed Care – PPO | Admitting: Obstetrics and Gynecology

## 2022-01-06 VITALS — BP 106/60 | Ht 67.0 in | Wt 151.0 lb

## 2022-01-06 DIAGNOSIS — Z30431 Encounter for routine checking of intrauterine contraceptive device: Secondary | ICD-10-CM

## 2022-01-06 DIAGNOSIS — Z1151 Encounter for screening for human papillomavirus (HPV): Secondary | ICD-10-CM | POA: Insufficient documentation

## 2022-01-06 DIAGNOSIS — Z1231 Encounter for screening mammogram for malignant neoplasm of breast: Secondary | ICD-10-CM

## 2022-01-06 DIAGNOSIS — Z3686 Encounter for antenatal screening for cervical length: Secondary | ICD-10-CM | POA: Diagnosis not present

## 2022-01-06 DIAGNOSIS — Z01419 Encounter for gynecological examination (general) (routine) without abnormal findings: Secondary | ICD-10-CM

## 2022-01-06 DIAGNOSIS — Z124 Encounter for screening for malignant neoplasm of cervix: Secondary | ICD-10-CM

## 2022-01-06 NOTE — Patient Instructions (Signed)
I value your feedback and you entrusting us with your care. If you get a  patient survey, I would appreciate you taking the time to let us know about your experience today. Thank you! ? ? ?

## 2022-01-07 LAB — CYTOLOGY - PAP
Adequacy: ABSENT
Comment: NEGATIVE
Diagnosis: NEGATIVE
High risk HPV: NEGATIVE

## 2022-03-02 ENCOUNTER — Ambulatory Visit
Admission: RE | Admit: 2022-03-02 | Discharge: 2022-03-02 | Disposition: A | Discharge status: Home / Self Care | Payer: BC Managed Care – PPO | Source: Ambulatory Visit | Attending: Obstetrics and Gynecology | Admitting: Obstetrics and Gynecology

## 2022-03-02 DIAGNOSIS — Z1231 Encounter for screening mammogram for malignant neoplasm of breast: Secondary | ICD-10-CM | POA: Insufficient documentation

## 2022-08-03 ENCOUNTER — Encounter: Payer: Self-pay | Admitting: Internal Medicine

## 2022-08-03 ENCOUNTER — Ambulatory Visit (INDEPENDENT_AMBULATORY_CARE_PROVIDER_SITE_OTHER): Payer: BC Managed Care – PPO | Admitting: Internal Medicine

## 2022-08-03 VITALS — BP 132/86 | HR 90 | Temp 96.9°F | Ht 67.0 in | Wt 150.0 lb

## 2022-08-03 DIAGNOSIS — Z1159 Encounter for screening for other viral diseases: Secondary | ICD-10-CM | POA: Diagnosis not present

## 2022-08-03 DIAGNOSIS — Z114 Encounter for screening for human immunodeficiency virus [HIV]: Secondary | ICD-10-CM

## 2022-08-03 DIAGNOSIS — Z0000 Encounter for general adult medical examination without abnormal findings: Secondary | ICD-10-CM

## 2022-08-03 LAB — CBC: WBC: 5.9 10*3/uL (ref 3.8–10.8)

## 2022-08-03 NOTE — Patient Instructions (Signed)

## 2022-08-03 NOTE — Progress Notes (Signed)
Subjective:    Patient ID: Tanya Gillespie, female    DOB: 11/11/1975, 46 y.o.   MRN: [id number]  HPI  Patient presents to clinic today for annual exam.  Flu: 07/2019 Tetanus: 01/2018 COVID:never Pap smear: 12/2021 Mammogram: 03/2022 Vision screening: annually Dentist: biannually  Diet: She does eat meat. She consumes fruits and veggies. She tries to avoid fried foods. She drinks mostly water, tea, Dt. Soda and milk Exercise: None  Review of Systems     Past Medical History:  Diagnosis Date   ASCUS of cervix with negative high risk HPV 05/2011   Family history of breast cancer 2014   IBIS=17.9%   Genetic testing of female 06/2013   BRCA/BART NEG   History of Papanicolaou smear of cervix 06/25/2015   NEG   Migraine    DR. MEAD    Current Outpatient Medications  Medication Sig Dispense Refill   Cholecalciferol (PA VITAMIN D-3 GUMMY PO) Take by mouth.     levonorgestrel (MIRENA) 20 MCG/24HR IUD 1 each by Intrauterine route once.     No current facility-administered medications for this visit.    Allergies  Allergen Reactions   Penicillins Hives    Family History  Problem Relation Age of Onset   Hypertension Father    Migraines Father    Crohn's disease Brother    Breast cancer Maternal Grandmother 97       DECEASED AGE 47   Cancer Maternal Grandfather 21       MELANOMA OF SKIN   Heart disease Paternal Grandfather        MI   Breast cancer Other        MASTECTOMY   Cancer Mother 38       thyroid ca - taken out   Crohn's disease Mother    Alzheimer's disease Paternal Grandmother     Social History   Socioeconomic History   Marital status: Married    Spouse name: Not on file   Number of children: 2   Years of education: 16   Highest education level: Bachelor's degree (e.g., BA, AB, BS)  Occupational History   Occupation: TEACHER  Tobacco Use   Smoking status: Former    Packs/day: 0.50    Years: 2.00    Total pack years: 1.00    Types:  Cigarettes   Smokeless tobacco: Never  Vaping Use   Vaping Use: Never used  Substance and Sexual Activity   Alcohol use: Yes    Alcohol/week: 2.0 standard drinks of alcohol    Types: 2 Cans of beer per week    Comment: OCC   Drug use: No   Sexual activity: Yes    Birth control/protection: I.U.D.    Comment: Mirena  Other Topics Concern   Not on file  Social History Narrative   Not on file   Social Determinants of Health   Financial Resource Strain: Not on file  Food Insecurity: Not on file  Transportation Needs: Not on file  Physical Activity: Not on file  Stress: Not on file  Social Connections: Not on file  Intimate Partner Violence: Not At Risk (02/15/2018)   Humiliation, Afraid, Rape, and Kick questionnaire    Fear of Current or Ex-Partner: No    Emotionally Abused: No    Physically Abused: No    Sexually Abused: No     Constitutional: Denies fever, malaise, fatigue, headache or abrupt weight changes.  HEENT: Denies eye pain, eye redness, ear pain, ringing in the ears,  wax buildup, runny nose, nasal congestion, bloody nose, or sore throat. Respiratory: Denies difficulty breathing, shortness of breath, cough or sputum production.   Cardiovascular: Denies chest pain, chest tightness, palpitations or swelling in the hands or feet.  Gastrointestinal: Denies abdominal pain, bloating, constipation, diarrhea or blood in the stool.  GU: Denies urgency, frequency, pain with urination, burning sensation, blood in urine, odor or discharge. Musculoskeletal: Denies decrease in range of motion, difficulty with gait, muscle pain or joint pain and swelling.  Skin: Denies redness, rashes, lesions or ulcercations.  Neurological: Denies dizziness, difficulty with memory, difficulty with speech or problems with balance and coordination.  Psych: Denies anxiety, depression, SI/HI.  No other specific complaints in a complete review of systems (except as listed in HPI above).  Objective:    Physical Exam BP 132/86 (BP Location: Right Arm, Patient Position: Sitting, Cuff Size: Normal)   Pulse 90   Temp (!) 96.9 F (36.1 C) (Temporal)   Ht '5\' 7"'  (1.702 m)   Wt 150 lb (68 kg)   SpO2 98%   BMI 23.49 kg/m   Wt Readings from Last 3 Encounters:  01/06/22 151 lb (68.5 kg)  07/28/21 143 lb 9.6 oz (65.1 kg)  12/18/20 149 lb (67.6 kg)    General: Appears her stated age, well developed, well nourished in NAD. Skin: Warm, dry and intact.  HEENT: Head: normal shape and size; Eyes: sclera white, no icterus, conjunctiva pink, PERRLA and EOMs intact;  Neck:  Neck supple, trachea midline. No masses, lumps or thyromegaly present.  Cardiovascular: Normal rate and rhythm. S1,S2 noted.  No murmur, rubs or gallops noted. No JVD or BLE edema.  Pulmonary/Chest: Normal effort and positive vesicular breath sounds. No respiratory distress. No wheezes, rales or ronchi noted.  Abdomen: Normal bowel sounds.  Musculoskeletal: Strength 5/5 BUE/BLE. No difficulty with gait.  Neurological: Alert and oriented. Cranial nerves II-XII grossly intact. Coordination normal.  Psychiatric: Mood and affect normal. Behavior is normal. Judgment and thought content normal.   BMET    Component Value Date/Time   NA 141 07/25/2020 0902   K 4.5 07/25/2020 0902   CL 103 07/25/2020 0902   CO2 28 07/25/2020 0902   GLUCOSE 92 07/25/2020 0902   BUN 12 07/25/2020 0902   CREATININE 0.76 07/25/2020 0902   CALCIUM 9.9 07/25/2020 0902   GFRNONAA 97 07/25/2020 0902   GFRAA 113 07/25/2020 0902    Lipid Panel     Component Value Date/Time   CHOL 197 07/25/2020 0902   TRIG 59 07/25/2020 0902   HDL 92 07/25/2020 0902   CHOLHDL 2.1 07/25/2020 0902   LDLCALC 91 07/25/2020 0902    CBC    Component Value Date/Time   WBC 4.4 07/25/2020 0902   RBC 4.36 07/25/2020 0902   HGB 14.0 07/25/2020 0902   HCT 41.9 07/25/2020 0902   PLT 186 07/25/2020 0902   MCV 96.1 07/25/2020 0902   MCH 32.1 07/25/2020 0902   MCHC  33.4 07/25/2020 0902   RDW 12.0 07/25/2020 0902   LYMPHSABS 972 07/25/2020 0902   EOSABS 79 07/25/2020 0902   BASOSABS 22 07/25/2020 0902    Hgb A1C Lab Results  Component Value Date   HGBA1C 4.6 04/05/2018           Assessment & Plan:   Preventative Health Maintenance:  She declines Flu shot today Tetanus UTD Encouraged her to get her covid booster Pap smear UTD Mammogram UTD Encouraged her to consume a balanced diet and exercise regimen  Advised her to see an eye doctor and dentist annually Will check CBC, CMET, lipid profile, HIV and Hep C today  RTC in 1 year, sooner if needed Webb Silversmith, NP

## 2022-08-04 LAB — CBC
HCT: 41 % (ref 35.0–45.0)
Hemoglobin: 13.8 g/dL (ref 11.7–15.5)
MCH: 32.5 pg (ref 27.0–33.0)
MCHC: 33.7 g/dL (ref 32.0–36.0)
MCV: 96.5 fL (ref 80.0–100.0)
MPV: 9.7 fL (ref 7.5–12.5)
Platelets: 202 10*3/uL (ref 140–400)
RBC: 4.25 10*6/uL (ref 3.80–5.10)
RDW: 12.5 % (ref 11.0–15.0)

## 2022-08-04 LAB — LIPID PANEL
Cholesterol: 213 mg/dL — ABNORMAL HIGH (ref <200)
HDL: 109 mg/dL (ref >=50)
LDL Cholesterol (Calc): 89 mg/dL (calc)
Non-HDL Cholesterol (Calc): 104 mg/dL (calc) (ref <130)
Total CHOL/HDL Ratio: 2 (calc) (ref <5.0)
Triglycerides: 65 mg/dL (ref <150)

## 2022-08-04 LAB — COMPLETE METABOLIC PANEL WITH GFR
AG Ratio: 1.8 (calc) (ref 1.0–2.5)
ALT: 23 U/L (ref 6–29)
AST: 26 U/L (ref 10–30)
Albumin: 4.7 g/dL (ref 3.6–5.1)
Alkaline phosphatase (APISO): 31 U/L (ref 31–125)
BUN: 13 mg/dL (ref 7–25)
CO2: 29 mmol/L (ref 20–32)
Calcium: 9.6 mg/dL (ref 8.6–10.2)
Chloride: 104 mmol/L (ref 98–110)
Creat: 0.73 mg/dL (ref 0.50–0.99)
Globulin: 2.6 g/dL (calc) (ref 1.9–3.7)
Glucose, Bld: 86 mg/dL (ref 65–99)
Potassium: 4.2 mmol/L (ref 3.5–5.3)
Sodium: 143 mmol/L (ref 135–146)
Total Bilirubin: 0.8 mg/dL (ref 0.2–1.2)
Total Protein: 7.3 g/dL (ref 6.1–8.1)
eGFR: 105 mL/min/{1.73_m2} (ref >=60)

## 2022-08-04 LAB — HIV ANTIBODY (ROUTINE TESTING W REFLEX): HIV 1&2 Ab, 4th Generation: NONREACTIVE

## 2022-08-04 LAB — HEPATITIS C ANTIBODY: Hepatitis C Ab: NONREACTIVE

## 2023-02-09 ENCOUNTER — Other Ambulatory Visit: Payer: Self-pay | Admitting: Obstetrics and Gynecology

## 2023-02-09 DIAGNOSIS — Z1231 Encounter for screening mammogram for malignant neoplasm of breast: Secondary | ICD-10-CM

## 2023-03-05 ENCOUNTER — Other Ambulatory Visit: Payer: Self-pay | Admitting: Obstetrics and Gynecology

## 2023-03-05 ENCOUNTER — Ambulatory Visit
Admission: RE | Admit: 2023-03-05 | Discharge: 2023-03-05 | Disposition: A | Discharge status: Home / Self Care | Payer: BC Managed Care – PPO | Source: Ambulatory Visit | Attending: Obstetrics and Gynecology | Admitting: Obstetrics and Gynecology

## 2023-03-05 DIAGNOSIS — Z1231 Encounter for screening mammogram for malignant neoplasm of breast: Secondary | ICD-10-CM

## 2023-03-21 NOTE — Progress Notes (Unsigned)
PCP:  Lorre Munroe, NP   No chief complaint on file.    HPI:      Tanya Gillespie is a 47 y.o. [id number] who LMP was No LMP recorded. (Menstrual status: IUD)., presents today for her annual examination.  Her menses are absent now with IUD. Dysmenorrhea none. No vasomotor sx.  Sex activity: single partner, contraception - IUD. Mirena placed 08/19/16. Strings not visible at 4 wk f/u appt, but IUD placement confirmed with u/s in past.  Last Pap: 01/06/22  Results were: no abnormalities /neg HPV DNA.  Hx of STDs: none  Last mammo: 03/05/23  Results were normal, repeat in 12 months. There is a FH of breast cancer in her MGM and pat grt aunt, genetic testing not indicated but done in the past when we thought MGM was younger with breast cancer dx. Pt is BRCA/BART neg 2014.There is no FH of ovarian cancer. The patient does self-breast exams.  Tobacco use: The patient denies current or previous tobacco use. Alcohol use: social drinker No drug use.  Exercise: somewhat active  She does get adequate calcium and Vitamin D in her diet. Labs with PCP.   Past Medical History:  Diagnosis Date   ASCUS of cervix with negative high risk HPV 05/2011   Family history of breast cancer 2014   IBIS=17.9%   Genetic testing of female 06/2013   BRCA/BART NEG   History of Papanicolaou smear of cervix 06/25/2015   NEG   Migraine    DR. MEAD    Past Surgical History:  Procedure Laterality Date   AUGMENTATION MAMMAPLASTY Bilateral 2015   BREAST ENHANCEMENT SURGERY     CESAREAN SECTION  09/21/2006   OLIGO   CESAREAN SECTION  07/11/2009   PREV C/S   INTRAUTERINE DEVICE (IUD) INSERTION  08/2011   MIRENA    Family History  Problem Relation Age of Onset   Hypertension Father    Migraines Father    Crohn's disease Brother    Breast cancer Maternal Grandmother 1       DECEASED AGE 84   Cancer Maternal Grandfather 49       MELANOMA OF SKIN   Heart disease Paternal Grandfather        MI    Breast cancer Other        MASTECTOMY   Cancer Mother 64       thyroid ca - taken out   Crohn's disease Mother    Alzheimer's disease Paternal Grandmother     Social History   Socioeconomic History   Marital status: Married    Spouse name: Not on file   Number of children: 2   Years of education: 16   Highest education level: Bachelor's degree (e.g., BA, AB, BS)  Occupational History   Occupation: TEACHER  Tobacco Use   Smoking status: Former    Packs/day: 0.50    Years: 2.00    Additional pack years: 0.00    Total pack years: 1.00    Types: Cigarettes   Smokeless tobacco: Never  Vaping Use   Vaping Use: Never used  Substance and Sexual Activity   Alcohol use: Yes    Alcohol/week: 2.0 standard drinks of alcohol    Types: 2 Cans of beer per week    Comment: OCC   Drug use: No   Sexual activity: Yes    Birth control/protection: I.U.D.    Comment: Mirena  Other Topics Concern   Not on file  Social  History Narrative   Not on file   Social Determinants of Health   Financial Resource Strain: Not on file  Food Insecurity: Not on file  Transportation Needs: Not on file  Physical Activity: Not on file  Stress: Not on file  Social Connections: Not on file  Intimate Partner Violence: Not At Risk (02/15/2018)   Humiliation, Afraid, Rape, and Kick questionnaire    Fear of Current or Ex-Partner: No    Emotionally Abused: No    Physically Abused: No    Sexually Abused: No    No outpatient medications have been marked as taking for the 03/22/23 encounter (Appointment) with Aviah Sorci, Helmut Muster B, PA-C.     ROS:  Review of Systems  Constitutional:  Negative for fatigue, fever and unexpected weight change.  Respiratory:  Negative for cough, shortness of breath and wheezing.   Cardiovascular:  Negative for chest pain, palpitations and leg swelling.  Gastrointestinal:  Negative for blood in stool, constipation, diarrhea, nausea and vomiting.  Endocrine: Negative for cold  intolerance, heat intolerance and polyuria.  Genitourinary:  Negative for dyspareunia, dysuria, flank pain, frequency, genital sores, hematuria, menstrual problem, pelvic pain, urgency, vaginal bleeding, vaginal discharge and vaginal pain.  Musculoskeletal:  Negative for back pain, joint swelling and myalgias.  Skin:  Negative for rash.  Neurological:  Negative for dizziness, syncope, light-headedness, numbness and headaches.  Hematological:  Negative for adenopathy.  Psychiatric/Behavioral:  Negative for agitation, confusion, sleep disturbance and suicidal ideas. The patient is not nervous/anxious.      Objective: There were no vitals taken for this visit.   Physical Exam Constitutional:      Appearance: She is well-developed.  Genitourinary:     Vulva normal.     Genitourinary Comments: IUD STRINGS NOT VISIBLE/PALPABLE     Right Labia: No rash, tenderness or lesions.    Left Labia: No tenderness, lesions or rash.    No vaginal discharge, erythema or tenderness.      Right Adnexa: not tender and no mass present.    Left Adnexa: not tender and no mass present.    No cervical motion tenderness, friability or polyp.     Uterus is not enlarged or tender.  Breasts:    Right: No mass, nipple discharge, skin change or tenderness.     Left: No mass, nipple discharge, skin change or tenderness.  Neck:     Thyroid: No thyromegaly.  Cardiovascular:     Rate and Rhythm: Normal rate and regular rhythm.     Heart sounds: Normal heart sounds. No murmur heard. Pulmonary:     Effort: Pulmonary effort is normal.     Breath sounds: Normal breath sounds.  Abdominal:     Palpations: Abdomen is soft.     Tenderness: There is no abdominal tenderness. There is no guarding or rebound.  Musculoskeletal:        General: Normal range of motion.     Cervical back: Normal range of motion.  Lymphadenopathy:     Cervical: No cervical adenopathy.  Neurological:     General: No focal deficit present.      Mental Status: She is alert and oriented to person, place, and time.     Cranial Nerves: No cranial nerve deficit.  Skin:    General: Skin is warm and dry.  Psychiatric:        Mood and Affect: Mood normal.        Behavior: Behavior normal.        Thought Content:  Thought content normal.        Judgment: Judgment normal.  Vitals reviewed.     Assessment/Plan: Encounter for annual routine gynecological examination  Cervical cancer screening - Plan: Cytology - PAP  Screening for HPV (human papillomavirus) - Plan: Cytology - PAP  Encounter for routine checking of intrauterine contraceptive device (IUD); has 8 yr indication now; IUD strings not in cx os  Encounter for screening mammogram for malignant neoplasm of breast; pt has mammo appt         GYN counsel breast self exam, mammography screening, adequate intake of calcium and vitamin D, diet and exercise     F/U  No follow-ups on file.  Chloeanne Poteet B. Keevin Panebianco, PA-C 03/21/2023 7:05 PM

## 2023-03-22 ENCOUNTER — Encounter: Payer: Self-pay | Admitting: Obstetrics and Gynecology

## 2023-03-22 ENCOUNTER — Ambulatory Visit (INDEPENDENT_AMBULATORY_CARE_PROVIDER_SITE_OTHER): Payer: BC Managed Care – PPO | Admitting: Obstetrics and Gynecology

## 2023-03-22 VITALS — BP 121/77 | HR 92 | Ht 67.0 in | Wt 159.0 lb

## 2023-03-22 DIAGNOSIS — Z1231 Encounter for screening mammogram for malignant neoplasm of breast: Secondary | ICD-10-CM

## 2023-03-22 DIAGNOSIS — Z30431 Encounter for routine checking of intrauterine contraceptive device: Secondary | ICD-10-CM

## 2023-03-22 DIAGNOSIS — Z01419 Encounter for gynecological examination (general) (routine) without abnormal findings: Secondary | ICD-10-CM | POA: Diagnosis not present

## 2023-03-22 NOTE — Patient Instructions (Signed)
I value your feedback and you entrusting us with your care. If you get a [facility] patient survey, I would appreciate you taking the time to let us know about your experience today. Thank you! ? ? ?

## 2023-08-05 ENCOUNTER — Encounter: Payer: Self-pay | Admitting: Internal Medicine

## 2023-08-05 ENCOUNTER — Ambulatory Visit: Payer: BC Managed Care – PPO | Admitting: Internal Medicine

## 2023-08-05 VITALS — BP 135/75 | HR 85 | Ht 67.0 in | Wt 165.0 lb

## 2023-08-05 DIAGNOSIS — R739 Hyperglycemia, unspecified: Secondary | ICD-10-CM

## 2023-08-05 DIAGNOSIS — Z136 Encounter for screening for cardiovascular disorders: Secondary | ICD-10-CM | POA: Diagnosis not present

## 2023-08-05 DIAGNOSIS — Z808 Family history of malignant neoplasm of other organs or systems: Secondary | ICD-10-CM | POA: Diagnosis not present

## 2023-08-05 DIAGNOSIS — Z6825 Body mass index (BMI) 25.0-25.9, adult: Secondary | ICD-10-CM

## 2023-08-05 DIAGNOSIS — Z1211 Encounter for screening for malignant neoplasm of colon: Secondary | ICD-10-CM

## 2023-08-05 DIAGNOSIS — Z0001 Encounter for general adult medical examination with abnormal findings: Secondary | ICD-10-CM | POA: Diagnosis not present

## 2023-08-05 DIAGNOSIS — E663 Overweight: Secondary | ICD-10-CM

## 2023-08-05 DIAGNOSIS — R232 Flushing: Secondary | ICD-10-CM

## 2023-08-05 NOTE — Patient Instructions (Signed)

## 2023-08-05 NOTE — Progress Notes (Signed)
Subjective:    Patient ID: Tanya Gillespie, female    DOB: 10/31/1976, 47 y.o.   MRN: [id number]  HPI  Patient presents to clinic today for her annual exam.  Flu: 07/2019 Tetanus: 01/2018 COVID: Never Pap smear: 12/2021 Mammogram: 03/2023 Colon screening: Never Vision screening: annually Dentist: biannually  Diet: She does eat meat. She consumes fruits and veggies. She does eat fried foods. She drinks mostly unsweet tea, dt. soda Exercise: Walking  Review of Systems     Past Medical History:  Diagnosis Date   ASCUS of cervix with negative high risk HPV 05/2011   Family history of breast cancer 2014   IBIS=17.9%   Genetic testing of female 06/2013   BRCA/BART NEG   History of Papanicolaou smear of cervix 06/25/2015   NEG   Migraine    DR. MEAD    Current Outpatient Medications  Medication Sig Dispense Refill   Cholecalciferol (PA VITAMIN D-3 GUMMY PO) Take by mouth.     levonorgestrel (MIRENA) 20 MCG/24HR IUD 1 each by Intrauterine route once.     SOOLANTRA 1 % CREA Apply topically daily.     No current facility-administered medications for this visit.    Allergies  Allergen Reactions   Penicillins Hives    Family History  Problem Relation Age of Onset   Hypertension Father    Migraines Father    Crohn's disease Brother    Breast cancer Maternal Grandmother 75       DECEASED AGE 39   Cancer Maternal Grandfather 65       MELANOMA OF SKIN   Heart disease Paternal Grandfather        MI   Breast cancer Other        MASTECTOMY   Cancer Mother 16       thyroid ca - taken out   Crohn's disease Mother    Alzheimer's disease Paternal Grandmother     Social History   Socioeconomic History   Marital status: Married    Spouse name: Not on file   Number of children: 2   Years of education: 16   Highest education level: Bachelor's degree (e.g., BA, AB, BS)  Occupational History   Occupation: TEACHER  Tobacco Use   Smoking status: Former    Current  packs/day: 0.50    Average packs/day: 0.5 packs/day for 2.0 years (1.0 ttl pk-yrs)    Types: Cigarettes   Smokeless tobacco: Never  Vaping Use   Vaping status: Never Used  Substance and Sexual Activity   Alcohol use: Yes    Alcohol/week: 2.0 standard drinks of alcohol    Types: 2 Cans of beer per week    Comment: OCC   Drug use: No   Sexual activity: Yes    Birth control/protection: I.U.D.    Comment: Mirena  Other Topics Concern   Not on file  Social History Narrative   Not on file   Social Determinants of Health   Financial Resource Strain: Low Risk  (08/02/2023)   Overall Financial Resource Strain (CARDIA)    Difficulty of Paying Living Expenses: Not hard at all  Food Insecurity: No Food Insecurity (08/02/2023)   Hunger Vital Sign    Worried About Running Out of Food in the Last Year: Never true    Ran Out of Food in the Last Year: Never true  Transportation Needs: No Transportation Needs (08/02/2023)   PRAPARE - Administrator, Civil Service (Medical): No    Lack  of Transportation (Non-Medical): No  Physical Activity: Insufficiently Active (08/02/2023)   Exercise Vital Sign    Days of Exercise per Week: 2 days    Minutes of Exercise per Session: 30 min  Stress: No Stress Concern Present (08/02/2023)   [employer] of Occupational Health - Occupational Stress Questionnaire    Feeling of Stress : Not at all  Social Connections: Moderately Integrated (08/02/2023)   Social Connection and Isolation Panel [NHANES]    Frequency of Communication with Friends and Family: More than three times a week    Frequency of Social Gatherings with Friends and Family: More than three times a week    Attends Religious Services: More than 4 times per year    Active Member of [employer] or Organizations: No    Attends Banker Meetings: Not on file    Marital Status: Married  Catering manager Violence: Not At Risk (02/15/2018)   Humiliation, Afraid, Rape, and Kick  questionnaire    Fear of Current or Ex-Partner: No    Emotionally Abused: No    Physically Abused: No    Sexually Abused: No     Constitutional: Pt reports difficulty losing weight. Denies fever, malaise, fatigue, headache.  HEENT: Denies eye pain, eye redness, ear pain, ringing in the ears, wax buildup, runny nose, nasal congestion, bloody nose, or sore throat. Respiratory: Denies difficulty breathing, shortness of breath, cough or sputum production.   Cardiovascular: Denies chest pain, chest tightness, palpitations or swelling in the hands or feet.  Gastrointestinal: Denies abdominal pain, bloating, constipation, diarrhea or blood in the stool.  GU: Denies urgency, frequency, pain with urination, burning sensation, blood in urine, odor or discharge. Musculoskeletal: Denies decrease in range of motion, difficulty with gait, muscle pain or joint pain and swelling.  Skin: Denies redness, rashes, lesions or ulcercations.  Neurological: Pt reports hot flashes. Denies dizziness, difficulty with memory, difficulty with speech or problems with balance and coordination.  Psych: Denies anxiety, depression, SI/HI.  No other specific complaints in a complete review of systems (except as listed in HPI above).  Objective:   Physical Exam  BP (!) 150/90   Pulse 85   Ht 5\' 7"  (1.702 m)   Wt 165 lb (74.8 kg)   SpO2 98%   BMI 25.84 kg/m   Wt Readings from Last 3 Encounters:  03/22/23 159 lb (72.1 kg)  08/03/22 150 lb (68 kg)  01/06/22 151 lb (68.5 kg)    General: Appears her stated age, obese, in NAD. Skin: Warm, dry and intact.  HEENT: Head: normal shape and size; Eyes: sclera white, no icterus, conjunctiva pink, PERRLA and EOMs intact;  Neck:  Neck supple, trachea midline. No masses, lumps or thyromegaly present.  Cardiovascular: Normal rate and rhythm. S1,S2 noted.  No murmur, rubs or gallops noted. No JVD or BLE edema.  Pulmonary/Chest: Normal effort and positive vesicular breath  sounds. No respiratory distress. No wheezes, rales or ronchi noted.  Abdomen: Soft and nontender.  Musculoskeletal: Strength 5/5 BUE/BLE No difficulty with gait.  Neurological: Alert and oriented. Cranial nerves II-XII grossly intact. Coordination normal.  Psychiatric: Mood and affect normal. Behavior is normal. Judgment and thought content normal.    BMET    Component Value Date/Time   NA 143 08/03/2022 0843   K 4.2 08/03/2022 0843   CL 104 08/03/2022 0843   CO2 29 08/03/2022 0843   GLUCOSE 86 08/03/2022 0843   BUN 13 08/03/2022 0843   CREATININE 0.73 08/03/2022 0843   CALCIUM  9.6 08/03/2022 0843   GFRNONAA 97 07/25/2020 0902   GFRAA 113 07/25/2020 0902    Lipid Panel     Component Value Date/Time   CHOL 213 (H) 08/03/2022 0843   TRIG 65 08/03/2022 0843   HDL 109 08/03/2022 0843   CHOLHDL 2.0 08/03/2022 0843   LDLCALC 89 08/03/2022 0843    CBC    Component Value Date/Time   WBC 5.9 08/03/2022 0843   RBC 4.25 08/03/2022 0843   HGB 13.8 08/03/2022 0843   HCT 41.0 08/03/2022 0843   PLT 202 08/03/2022 0843   MCV 96.5 08/03/2022 0843   MCH 32.5 08/03/2022 0843   MCHC 33.7 08/03/2022 0843   RDW 12.5 08/03/2022 0843   LYMPHSABS 972 07/25/2020 0902   EOSABS 79 07/25/2020 0902   BASOSABS 22 07/25/2020 0902    Hgb A1C Lab Results  Component Value Date   HGBA1C 4.6 04/05/2018           Assessment & Plan:   Preventative health maintenance:  Flu shot declined Tetanus UTD Encouraged her to get her COVID-vaccine Pap smear UTD Mammogram UTD Referral to GI for screening colonoscopy Encouraged her to consume a balanced diet and exercise regimen Advised her to see an eye doctor and dentist annually We will check CBC, c-Met, lipid, A1c today  Hot flashes:  Will check FSH/LH  RTC in 1 year, sooner if needed Nicki Reaper, NP

## 2023-08-06 ENCOUNTER — Encounter: Payer: Self-pay | Admitting: Internal Medicine

## 2023-08-06 DIAGNOSIS — E663 Overweight: Secondary | ICD-10-CM | POA: Insufficient documentation

## 2023-08-06 LAB — LIPID PANEL
Cholesterol: 224 mg/dL — ABNORMAL HIGH (ref <200)
HDL: 99 mg/dL (ref >=50)
LDL Cholesterol (Calc): 107 mg/dL — ABNORMAL HIGH
Non-HDL Cholesterol (Calc): 125 mg/dL (ref <130)
Total CHOL/HDL Ratio: 2.3 (calc) (ref <5.0)
Triglycerides: 89 mg/dL (ref <150)

## 2023-08-06 LAB — TSH: TSH: 3.49 m[IU]/L

## 2023-08-06 LAB — COMPLETE METABOLIC PANEL WITH GFR
AG Ratio: 1.8 (calc) (ref 1.0–2.5)
ALT: 19 U/L (ref 6–29)
AST: 21 U/L (ref 10–35)
Albumin: 4.7 g/dL (ref 3.6–5.1)
Alkaline phosphatase (APISO): 42 U/L (ref 31–125)
BUN: 17 mg/dL (ref 7–25)
CO2: 29 mmol/L (ref 20–32)
Calcium: 10 mg/dL (ref 8.6–10.2)
Chloride: 102 mmol/L (ref 98–110)
Creat: 0.73 mg/dL (ref 0.50–0.99)
Globulin: 2.6 g/dL (ref 1.9–3.7)
Glucose, Bld: 89 mg/dL (ref 65–99)
Potassium: 4.6 mmol/L (ref 3.5–5.3)
Sodium: 143 mmol/L (ref 135–146)
Total Bilirubin: 0.5 mg/dL (ref 0.2–1.2)
Total Protein: 7.3 g/dL (ref 6.1–8.1)
eGFR: 103 mL/min/{1.73_m2} (ref >=60)

## 2023-08-06 LAB — CBC
HCT: 39 % (ref 35.0–45.0)
Hemoglobin: 12.8 g/dL (ref 11.7–15.5)
MCH: 31.5 pg (ref 27.0–33.0)
MCHC: 32.8 g/dL (ref 32.0–36.0)
MCV: 96.1 fL (ref 80.0–100.0)
MPV: 10 fL (ref 7.5–12.5)
Platelets: 202 10*3/uL (ref 140–400)
RBC: 4.06 10*6/uL (ref 3.80–5.10)
RDW: 12.3 % (ref 11.0–15.0)
WBC: 5.2 10*3/uL (ref 3.8–10.8)

## 2023-08-06 LAB — HEMOGLOBIN A1C
Hgb A1c MFr Bld: 5.1 %{Hb} (ref <5.7)
Mean Plasma Glucose: 100 mg/dL
eAG (mmol/L): 5.5 mmol/L

## 2023-08-06 LAB — FSH/LH
FSH: 65.2 m[IU]/mL
LH: 45.7 m[IU]/mL

## 2023-08-06 NOTE — Assessment & Plan Note (Signed)
Encouraged diet and exercise for weight loss ?

## 2023-08-09 ENCOUNTER — Telehealth: Payer: Self-pay

## 2023-08-09 NOTE — Telephone Encounter (Signed)
Patient called back in to schedule her colonoscopy.

## 2023-08-10 ENCOUNTER — Telehealth: Payer: Self-pay

## 2023-08-10 NOTE — Telephone Encounter (Signed)
LVM for pt to return my call to schedule colonoscopy.  Thanks, [place], [place]

## 2023-08-16 ENCOUNTER — Other Ambulatory Visit: Payer: Self-pay

## 2023-08-16 ENCOUNTER — Telehealth: Payer: Self-pay

## 2023-08-16 DIAGNOSIS — Z1211 Encounter for screening for malignant neoplasm of colon: Secondary | ICD-10-CM

## 2023-08-16 MED ORDER — NA SULFATE-K SULFATE-MG SULF 17.5-3.13-1.6 GM/177ML PO SOLN
1.0000 | Freq: Once | ORAL | 0 refills | Status: AC
Start: 2023-08-16 — End: 2023-08-16

## 2023-08-16 NOTE — Telephone Encounter (Signed)
Gastroenterology Pre-Procedure Review  Request Date: 09/20/23 Requesting Physician: Dr. Allegra Lai  PATIENT REVIEW QUESTIONS: The patient responded to the following health history questions as indicated:    1. Are you having any GI issues? no 2. Do you have a personal history of Polyps? no 3. Do you have a family history of Colon Cancer or Polyps? no 4. Diabetes Mellitus? no 5. Joint replacements in the past 12 months?no 6. Major health problems in the past 3 months?no 7. Any artificial heart valves, MVP, or defibrillator?no    MEDICATIONS & ALLERGIES:    Patient reports the following regarding taking any anticoagulation/antiplatelet therapy:   Plavix, Coumadin, Eliquis, Xarelto, Lovenox, Pradaxa, Brilinta, or Effient? no Aspirin? no  Patient confirms/reports the following medications:  Current Outpatient Medications  Medication Sig Dispense Refill   Cholecalciferol (PA VITAMIN D-3 GUMMY PO) Take by mouth.     levonorgestrel (MIRENA) 20 MCG/24HR IUD 1 each by Intrauterine route once.     SOOLANTRA 1 % CREA Apply topically daily.     No current facility-administered medications for this visit.    Patient confirms/reports the following allergies:  Allergies  Allergen Reactions   Penicillins Hives    No orders of the defined types were placed in this encounter.   AUTHORIZATION INFORMATION Primary Insurance: 1D#: Group #:  Secondary Insurance: 1D#: Group #:  SCHEDULE INFORMATION: Date: 09/20/23 Time: Location: [facility]

## 2023-08-18 ENCOUNTER — Encounter: Payer: Self-pay | Admitting: Obstetrics and Gynecology

## 2023-09-15 ENCOUNTER — Encounter: Payer: Self-pay | Admitting: Internal Medicine

## 2023-09-16 ENCOUNTER — Telehealth: Payer: Self-pay | Admitting: Gastroenterology

## 2023-09-16 ENCOUNTER — Telehealth: Payer: Self-pay

## 2023-09-16 ENCOUNTER — Ambulatory Visit: Payer: Self-pay | Admitting: *Deleted

## 2023-09-16 ENCOUNTER — Ambulatory Visit: Payer: BC Managed Care – PPO | Admitting: Internal Medicine

## 2023-09-16 ENCOUNTER — Encounter: Payer: Self-pay | Admitting: Internal Medicine

## 2023-09-16 VITALS — BP 108/70 | HR 79 | Temp 98.3°F | Ht 67.0 in | Wt 168.2 lb

## 2023-09-16 DIAGNOSIS — B029 Zoster without complications: Secondary | ICD-10-CM | POA: Diagnosis not present

## 2023-09-16 MED ORDER — VALACYCLOVIR HCL 1 G PO TABS: 1000.0000 mg | ORAL_TABLET | Freq: Three times a day (TID) | ORAL | 0 refills | Status: DC

## 2023-09-16 NOTE — Telephone Encounter (Signed)
okay

## 2023-09-16 NOTE — Telephone Encounter (Signed)
Advised patient I could work her in sooner if needed

## 2023-09-16 NOTE — Patient Instructions (Signed)

## 2023-09-16 NOTE — Progress Notes (Signed)
Subjective:    Patient ID: Tanya Gillespie, female    DOB: 03-Jan-1976, 47 y.o.   MRN: [id number]  HPI  Discussed the use of AI scribe software for clinical note transcription with the patient, who gave verbal consent to proceed.   The patient presented with a rash that started on Saturday or Sunday. Initially, the rash was itchy, but over time it developed into a burning, painful sensation with shooting pains. The rash is located in more than one area, following a dermatomal pattern from the midline of the back and spreading around the breast. The patient self-identified the rash as shingles, having had chickenpox as a child.  The patient reported that the rash has been progressively worsening since its onset. However, the rash has since grown and become more inflamed, with some lesions scabbing over.  The patient was not on any pain medication at the time of the consultation, stating they hadn't even taken a Tylenol that day. They were scheduled for a colonoscopy the following Monday and were concerned about potential interactions with the prescribed treatment for the rash.   Review of Systems   Past Medical History:  Diagnosis Date   ASCUS of cervix with negative high risk HPV 05/2011   Family history of breast cancer 2014   IBIS=17.9%   Genetic testing of female 06/2013   BRCA/BART NEG   History of Papanicolaou smear of cervix 06/25/2015   NEG   Migraine    DR. MEAD    Current Outpatient Medications  Medication Sig Dispense Refill   Cholecalciferol (PA VITAMIN D-3 GUMMY PO) Take by mouth.     levonorgestrel (MIRENA) 20 MCG/24HR IUD 1 each by Intrauterine route once.     SOOLANTRA 1 % CREA Apply topically daily.     No current facility-administered medications for this visit.    Allergies  Allergen Reactions   Penicillins Hives    Family History  Problem Relation Age of Onset   Hypertension Father    Migraines Father    Crohn's disease Brother    Breast cancer  Maternal Grandmother 48       DECEASED AGE 83   Cancer Maternal Grandfather 52       MELANOMA OF SKIN   Heart disease Paternal Grandfather        MI   Breast cancer Other        MASTECTOMY   Cancer Mother 27       thyroid ca - taken out   Crohn's disease Mother    Alzheimer's disease Paternal Grandmother     Social History   Socioeconomic History   Marital status: Married    Spouse name: Not on file   Number of children: 2   Years of education: 16   Highest education level: Bachelor's degree (e.g., BA, AB, BS)  Occupational History   Occupation: TEACHER  Tobacco Use   Smoking status: Former    Current packs/day: 0.50    Average packs/day: 0.5 packs/day for 2.0 years (1.0 ttl pk-yrs)    Types: Cigarettes   Smokeless tobacco: Never  Vaping Use   Vaping status: Never Used  Substance and Sexual Activity   Alcohol use: Yes    Alcohol/week: 2.0 standard drinks of alcohol    Types: 2 Cans of beer per week    Comment: OCC   Drug use: No   Sexual activity: Yes    Birth control/protection: I.U.D.    Comment: Mirena  Other Topics Concern   Not  on file  Social History Narrative   Not on file   Social Determinants of Health   Financial Resource Strain: Low Risk  (09/16/2023)   Overall Financial Resource Strain (CARDIA)    Difficulty of Paying Living Expenses: Not hard at all  Food Insecurity: No Food Insecurity (09/16/2023)   Hunger Vital Sign    Worried About Running Out of Food in the Last Year: Never true    Ran Out of Food in the Last Year: Never true  Transportation Needs: No Transportation Needs (09/16/2023)   PRAPARE - Administrator, Civil Service (Medical): No    Lack of Transportation (Non-Medical): No  Physical Activity: Insufficiently Active (09/16/2023)   Exercise Vital Sign    Days of Exercise per Week: 2 days    Minutes of Exercise per Session: 20 min  Stress: No Stress Concern Present (09/16/2023)   [employer] of Occupational Health  - Occupational Stress Questionnaire    Feeling of Stress : Not at all  Social Connections: Moderately Integrated (09/16/2023)   Social Connection and Isolation Panel [NHANES]    Frequency of Communication with Friends and Family: More than three times a week    Frequency of Social Gatherings with Friends and Family: Once a week    Attends Religious Services: More than 4 times per year    Active Member of [employer] or Organizations: No    Attends Banker Meetings: Not on file    Marital Status: Married  Catering manager Violence: Not At Risk (02/15/2018)   Humiliation, Afraid, Rape, and Kick questionnaire    Fear of Current or Ex-Partner: No    Emotionally Abused: No    Physically Abused: No    Sexually Abused: No     Constitutional: Denies fever, malaise, fatigue, headache or abrupt weight changes.  Respiratory: Denies difficulty breathing, shortness of breath, cough or sputum production.   Cardiovascular: Denies chest pain, chest tightness, palpitations or swelling in the hands or feet.  Skin: Patient reports rash.  Denies ulcercations.    No other specific complaints in a complete review of systems (except as listed in HPI above).      Objective:   Physical Exam  BP 108/70   Pulse 79   Temp 98.3 F (36.8 C)   Ht 5\' 7"  (1.702 m)   Wt 168 lb 3.2 oz (76.3 kg)   SpO2 99%   BMI 26.34 kg/m   Wt Readings from Last 3 Encounters:  08/05/23 165 lb (74.8 kg)  03/22/23 159 lb (72.1 kg)  08/03/22 150 lb (68 kg)    General: Appears her stated age, overweight, in NAD. Skin: Vesicular rash on erythematous base starting at midline upper back extending around to the left breast in a dermatomal pattern. Cardiovascular: Normal rate and rhythm.  Pulmonary/Chest: Normal effort and positive vesicular breath sounds. No respiratory distress. No wheezes, rales or ronchi noted.  Neurological: Alert and oriented.   BMET    Component Value Date/Time   NA 143 08/05/2023 1526   K  4.6 08/05/2023 1526   CL 102 08/05/2023 1526   CO2 29 08/05/2023 1526   GLUCOSE 89 08/05/2023 1526   BUN 17 08/05/2023 1526   CREATININE 0.73 08/05/2023 1526   CALCIUM 10.0 08/05/2023 1526   GFRNONAA 97 07/25/2020 0902   GFRAA 113 07/25/2020 0902    Lipid Panel     Component Value Date/Time   CHOL 224 (H) 08/05/2023 1526   TRIG 89 08/05/2023 1526  HDL 99 08/05/2023 1526   CHOLHDL 2.3 08/05/2023 1526   LDLCALC 107 (H) 08/05/2023 1526    CBC    Component Value Date/Time   WBC 5.2 08/05/2023 1526   RBC 4.06 08/05/2023 1526   HGB 12.8 08/05/2023 1526   HCT 39.0 08/05/2023 1526   PLT 202 08/05/2023 1526   MCV 96.1 08/05/2023 1526   MCH 31.5 08/05/2023 1526   MCHC 32.8 08/05/2023 1526   RDW 12.3 08/05/2023 1526   LYMPHSABS 972 07/25/2020 0902   EOSABS 79 07/25/2020 0902   BASOSABS 22 07/25/2020 0902    Hgb A1C Lab Results  Component Value Date   HGBA1C 5.1 08/05/2023            Assessment & Plan:   Assessment and Plan    Herpes Zoster (Shingles) Rash with itching, burning, and pain in a dermatomal pattern. Lesions in various stages of healing. Outside the typical window for treatment, but due to severity of symptoms, treatment will be initiated to potentially prevent post-herpetic neuralgia. -Start Valtrex 1g TID for 7 days. -Advise to avoid contact with infants <1 year, pregnant women, transplant recipients, and individuals undergoing cancer treatment. -Recommend Shingles vaccine when age appropriate (50 years).  Upcoming Colonoscopy Scheduled for Monday. Patient to confirm with surgical team regarding continuation of Valtrex. -Advise to continue Valtrex until instructed to stop by surgical team. -Resume Valtrex as soon as possible post-procedure if instructed to stop.        RTC in 11 months for your annual exam Nicki Reaper, NP

## 2023-09-16 NOTE — Telephone Encounter (Signed)
  Chief Complaint: rash to back and left side breast, painful Symptoms: red raised rash to back size bigger than mosquito bites itching like scabbed now. 3 areas to left side of breast this am size of pimple. No drainage. Waking up at night pain feels like shooting needle stick kind of pain Frequency: started Saturday/ Sunday  Pertinent Negatives: Patient denies severe pain no fever Disposition: [] ED /[] Urgent Care (no appt availability in office) / [x] Appointment(In office/virtual)/ []  [place] [facility]/ [] Home Care/ [] Refused Recommended Disposition /[] [facility]/ []  Follow-up with PCP Additional Notes:  Appt scheduled today .     Reason for Disposition  [1] Localized rash is very painful AND [2] no fever  Answer Assessment - Initial Assessment Questions 1. APPEARANCE of RASH: "Describe the rash."      On back rash red raised bigger than mosquito bites , left side of breast 3 areas pimple size 2. LOCATION: "Where is the rash located?"      See above  3. NUMBER: "How many spots are there?"      Na  4. SIZE: "How big are the spots?" (Inches, centimeters or compare to size of a coin)      See above 5. ONSET: "When did the rash start?"      Saturday/ Sunday  6. ITCHING: "Does the rash itch?" If Yes, ask: "How bad is the itch?"  (Scale 0-10; or none, mild, moderate, severe)     Yes not severe 7. PAIN: "Does the rash hurt?" If Yes, ask: "How bad is the pain?"  (Scale 0-10; or none, mild, moderate, severe)    - NONE (0): no pain    - MILD (1-3): doesn't interfere with normal activities     - MODERATE (4-7): interferes with normal activities or awakens from sleep     - SEVERE (8-10): excruciating pain, unable to do any normal activities     Moderate pain shooting pain . Needle stick pain  8. OTHER SYMPTOMS: "Do you have any other symptoms?" (e.g., fever)     Pain on back. 9. PREGNANCY: "Is there any chance you are pregnant?" "When was your last menstrual period?"      na  Protocols used: Rash or Redness - Localized-A-AH

## 2023-09-16 NOTE — Telephone Encounter (Signed)
Patient has requested to reschedule her colonoscopy due to recent diagnosis of Shingles.  Colonoscopy has been rescheduled from 09/20/23 to 11/11/23.  Will contact endoscopy dept tomorrow to make them aware of reschedule because no one answered when I called.  Colonoscopy instructions updated.  Referral updated.  Thanks,  [place], [place]

## 2023-09-16 NOTE — Telephone Encounter (Signed)
Pt requesting call back has a quick question before procedure

## 2023-09-17 NOTE — Telephone Encounter (Signed)
Called endo unit to make the change

## 2023-11-04 ENCOUNTER — Encounter: Payer: Self-pay | Admitting: Gastroenterology

## 2023-11-11 ENCOUNTER — Ambulatory Visit: Payer: 59 | Admitting: Anesthesiology

## 2023-11-11 ENCOUNTER — Encounter
Admission: RE | Disposition: A | Discharge status: Home / Self Care | Payer: Self-pay | Source: Home / Self Care | Attending: Gastroenterology

## 2023-11-11 ENCOUNTER — Ambulatory Visit
Admission: RE | Admit: 2023-11-11 | Discharge: 2023-11-11 | Disposition: A | Discharge status: Home / Self Care | Payer: 59 | Attending: Gastroenterology | Admitting: Gastroenterology

## 2023-11-11 ENCOUNTER — Other Ambulatory Visit: Payer: Self-pay

## 2023-11-11 ENCOUNTER — Encounter: Payer: Self-pay | Admitting: Gastroenterology

## 2023-11-11 DIAGNOSIS — Z1211 Encounter for screening for malignant neoplasm of colon: Secondary | ICD-10-CM | POA: Diagnosis present

## 2023-11-11 DIAGNOSIS — Z87891 Personal history of nicotine dependence: Secondary | ICD-10-CM | POA: Diagnosis not present

## 2023-11-11 DIAGNOSIS — K635 Polyp of colon: Secondary | ICD-10-CM | POA: Diagnosis not present

## 2023-11-11 HISTORY — PX: POLYPECTOMY: SHX5525

## 2023-11-11 HISTORY — PX: COLONOSCOPY WITH PROPOFOL: SHX5780

## 2023-11-11 SURGERY — COLONOSCOPY WITH PROPOFOL
Anesthesia: General

## 2023-11-11 MED ORDER — GLYCOPYRROLATE 0.2 MG/ML IJ SOLN
INTRAMUSCULAR | Status: AC
  Filled 2023-11-11: qty 2

## 2023-11-11 MED ORDER — LIDOCAINE HCL (CARDIAC) PF 100 MG/5ML IV SOSY
PREFILLED_SYRINGE | INTRAVENOUS | Status: DC | PRN
  Administered 2023-11-11: 60 mg via INTRAVENOUS

## 2023-11-11 MED ORDER — PROPOFOL 500 MG/50ML IV EMUL
INTRAVENOUS | Status: DC | PRN
  Administered 2023-11-11: 75 ug/kg/min via INTRAVENOUS

## 2023-11-11 MED ORDER — DEXMEDETOMIDINE HCL IN NACL 80 MCG/20ML IV SOLN
INTRAVENOUS | Status: DC | PRN
  Administered 2023-11-11: 20 ug via INTRAVENOUS

## 2023-11-11 MED ORDER — SODIUM CHLORIDE 0.9 % IV SOLN: INTRAVENOUS | Status: DC

## 2023-11-11 MED ORDER — ONDANSETRON HCL 4 MG/2ML IJ SOLN
INTRAMUSCULAR | Status: AC
  Filled 2023-11-11: qty 4

## 2023-11-11 MED ORDER — PHENYLEPHRINE 80 MCG/ML (10ML) SYRINGE FOR IV PUSH (FOR BLOOD PRESSURE SUPPORT)
PREFILLED_SYRINGE | INTRAVENOUS | Status: AC
  Filled 2023-11-11: qty 10

## 2023-11-11 MED ORDER — ATROPINE SULFATE 1 MG/10ML IJ SOSY
PREFILLED_SYRINGE | INTRAMUSCULAR | Status: AC
  Filled 2023-11-11: qty 10

## 2023-11-11 MED ORDER — PROPOFOL 1000 MG/100ML IV EMUL
INTRAVENOUS | Status: AC
  Filled 2023-11-11: qty 100

## 2023-11-11 MED ORDER — PROPOFOL 10 MG/ML IV BOLUS
INTRAVENOUS | Status: DC | PRN
  Administered 2023-11-11: 30 mg via INTRAVENOUS
  Administered 2023-11-11: 50 mg via INTRAVENOUS

## 2023-11-11 MED ORDER — LIDOCAINE HCL (PF) 2 % IJ SOLN
INTRAMUSCULAR | Status: AC
  Filled 2023-11-11: qty 25

## 2023-11-11 NOTE — Anesthesia Preprocedure Evaluation (Signed)
 Anesthesia Evaluation  Patient identified by MRN, date of birth, ID band Patient awake    Reviewed: Allergy & Precautions, NPO status , Patient's Chart, lab work & pertinent test results  History of Anesthesia Complications Negative for: history of anesthetic complications  Airway Mallampati: III  TM Distance: <3 FB Neck ROM: full    Dental  (+) Chipped   Pulmonary neg shortness of breath, former smoker   Pulmonary exam normal        Cardiovascular Exercise Tolerance: Good (-) angina negative cardio ROS Normal cardiovascular exam     Neuro/Psych  Headaches  negative psych ROS   GI/Hepatic negative GI ROS, Neg liver ROS,,,  Endo/Other  negative endocrine ROS    Renal/GU negative Renal ROS  negative genitourinary   Musculoskeletal   Abdominal   Peds  Hematology negative hematology ROS (+)   Anesthesia Other Findings Past Medical History: 05/2011: ASCUS of cervix with negative high risk HPV 2014: Family history of breast cancer     Comment:  IBIS=17.9% 06/2013: Genetic testing of female     Comment:  BRCA/BART NEG 06/25/2015: History of Papanicolaou smear of cervix     Comment:  NEG No date: Migraine     Comment:  DR. MEAD  Past Surgical History: 2015: AUGMENTATION MAMMAPLASTY; Bilateral No date: BREAST ENHANCEMENT SURGERY 09/21/2006: CESAREAN SECTION     Comment:  OLIGO 07/11/2009: CESAREAN SECTION     Comment:  PREV C/S 08/2011: INTRAUTERINE DEVICE (IUD) INSERTION     Comment:  MIRENA  BMI    Body Mass Index: 26.63 kg/m      Reproductive/Obstetrics negative OB ROS                             Anesthesia Physical Anesthesia Plan  ASA: 2  Anesthesia Plan: General   Post-op Pain Management:    Induction: Intravenous  PONV Risk Score and Plan: Propofol  infusion and TIVA  Airway Management Planned: Natural Airway and Nasal Cannula  Additional Equipment:    Intra-op Plan:   Post-operative Plan:   Informed Consent: I have reviewed the patients History and Physical, chart, labs and discussed the procedure including the risks, benefits and alternatives for the proposed anesthesia with the patient or authorized representative who has indicated his/her understanding and acceptance.     Dental Advisory Given  Plan Discussed with: Anesthesiologist, CRNA and Surgeon  Anesthesia Plan Comments: (Patient consented for risks of anesthesia including but not limited to:  - adverse reactions to medications - risk of airway placement if required - damage to eyes, teeth, lips or other oral mucosa - nerve damage due to positioning  - sore throat or hoarseness - Damage to heart, brain, nerves, lungs, other parts of body or loss of life  Patient voiced understanding and assent.)       Anesthesia Quick Evaluation

## 2023-11-11 NOTE — H&P (Signed)
 Tanya JONELLE Brooklyn, MD [address]  [address]  [place], [place] [address]  Main: [phone]  Fax: [phone] Pager: [phone]  Primary Care Physician:  Antonette Angeline ORN, NP Primary Gastroenterologist:  Dr. Corinn JONELLE Gillespie  Pre-Procedure History & Physical: HPI:  Tanya Gillespie is a 48 y.o. female is here for an colonoscopy.   Past Medical History:  Diagnosis Date   ASCUS of cervix with negative high risk HPV 05/2011   Family history of breast cancer 2014   IBIS=17.9%   Genetic testing of female 06/2013   BRCA/BART NEG   History of Papanicolaou smear of cervix 06/25/2015   NEG   Migraine    DR. MEAD    Past Surgical History:  Procedure Laterality Date   AUGMENTATION MAMMAPLASTY Bilateral 2015   BREAST ENHANCEMENT SURGERY     CESAREAN SECTION  09/21/2006   OLIGO   CESAREAN SECTION  07/11/2009   PREV C/S   INTRAUTERINE DEVICE (IUD) INSERTION  08/2011   MIRENA    Prior to Admission medications   Medication Sig Start Date End Date Taking? Authorizing Provider  Cholecalciferol (PA VITAMIN D-3 GUMMY PO) Take by mouth.    [provider]  levonorgestrel (MIRENA) 20 MCG/24HR IUD 1 each by Intrauterine route once.    [provider]  valACYclovir  (VALTREX ) 1000 MG tablet Take 1 tablet (1,000 mg total) by mouth 3 (three) times daily. For shingles Patient not taking: Reported on 11/04/2023 09/16/23   Antonette Angeline ORN, NP    Allergies as of 08/16/2023 - Review Complete 08/16/2023  Allergen Reaction Noted   Penicillins Hives 07/23/2017    Family History  Problem Relation Age of Onset   Hypertension Father    Migraines Father    Crohn's disease Brother    Breast cancer Maternal Grandmother 74       DECEASED AGE 71   Cancer Maternal Grandfather 39       MELANOMA OF SKIN   Heart disease Paternal Grandfather        MI   Breast cancer Other        MASTECTOMY   Cancer Mother 25       thyroid ca - taken out   Crohn's disease Mother    Alzheimer's  disease Paternal Grandmother     Social History   Socioeconomic History   Marital status: Married    Spouse name: Not on file   Number of children: 2   Years of education: 16   Highest education level: Bachelor's degree (e.g., BA, AB, BS)  Occupational History   Occupation: TEACHER  Tobacco Use   Smoking status: Former    Current packs/day: 0.50    Average packs/day: 0.5 packs/day for 2.0 years (1.0 ttl pk-yrs)    Types: Cigarettes   Smokeless tobacco: Never  Vaping Use   Vaping status: Never Used  Substance and Sexual Activity   Alcohol use: Yes    Alcohol/week: 2.0 standard drinks of alcohol    Types: 2 Cans of beer per week    Comment: OCC   Drug use: No   Sexual activity: Yes    Birth control/protection: I.U.D.    Comment: Mirena  Other Topics Concern   Not on file  Social History Narrative   Not on file   Social Drivers of Health   Financial Resource Strain: Low Risk  (09/16/2023)   Overall Financial Resource Strain (CARDIA)    Difficulty of Paying Living Expenses: Not hard at all  Food  Insecurity: No Food Insecurity (09/16/2023)   Hunger Vital Sign    Worried About Running Out of Food in the Last Year: Never true    Ran Out of Food in the Last Year: Never true  Transportation Needs: No Transportation Needs (09/16/2023)   PRAPARE - Administrator, Civil Service (Medical): No    Lack of Transportation (Non-Medical): No  Physical Activity: Insufficiently Active (09/16/2023)   Exercise Vital Sign    Days of Exercise per Week: 2 days    Minutes of Exercise per Session: 20 min  Stress: No Stress Concern Present (09/16/2023)   [employer] of Occupational Health - Occupational Stress Questionnaire    Feeling of Stress : Not at all  Social Connections: Moderately Integrated (09/16/2023)   Social Connection and Isolation Panel [NHANES]    Frequency of Communication with Friends and Family: More than three times a week    Frequency of Social  Gatherings with Friends and Family: Once a week    Attends Religious Services: More than 4 times per year    Active Member of [employer] or Organizations: No    Attends Banker Meetings: Not on file    Marital Status: Married  Catering Manager Violence: Not At Risk (02/15/2018)   Humiliation, Afraid, Rape, and Kick questionnaire    Fear of Current or Ex-Partner: No    Emotionally Abused: No    Physically Abused: No    Sexually Abused: No    Review of Systems: See HPI, otherwise negative ROS  Physical Exam: BP 120/80   Pulse 83   Temp 97.8 F (36.6 C) (Temporal)   Resp 16   Ht 5' 7 (1.702 m)   Wt 77.1 kg   LMP  (LMP Unknown)   SpO2 100%   BMI 26.63 kg/m  General:   Alert,  pleasant and cooperative in NAD Head:  Normocephalic and atraumatic. Neck:  Supple; no masses or thyromegaly. Lungs:  Clear throughout to auscultation.    Heart:  Regular rate and rhythm. Abdomen:  Soft, nontender and nondistended. Normal bowel sounds, without guarding, and without rebound.   Neurologic:  Alert and  oriented x4;  grossly normal neurologically.  Impression/Plan: Tanya Gillespie is here for an colonoscopy to be performed for colon cancer screening  Risks, benefits, limitations, and alternatives regarding  colonoscopy have been reviewed with the patient.  Questions have been answered.  All parties agreeable.   Tanya Brooklyn, MD  11/11/2023, 7:58 AM

## 2023-11-11 NOTE — Transfer of Care (Signed)
 Immediate Anesthesia Transfer of Care Note  Patient: Tanya Gillespie  Procedure(s) Performed: COLONOSCOPY WITH PROPOFOL  POLYPECTOMY  Patient Location: PACU  Anesthesia Type:General  Level of Consciousness: sedated  Airway & Oxygen Therapy: Patient Spontanous Breathing  Post-op Assessment: Report given to RN and Post -op Vital signs reviewed and stable  Post vital signs: Reviewed and stable  Last Vitals:  Vitals Value Taken Time  BP 92/52 11/11/23 0834  Temp 36 C 11/11/23 0833  Pulse 86 11/11/23 0835  Resp    SpO2 100 % 11/11/23 0835  Vitals shown include unfiled device data.  Last Pain:  Vitals:   11/11/23 0833  TempSrc: Temporal  PainSc: 0-No pain         Complications: No notable events documented.

## 2023-11-11 NOTE — Op Note (Signed)
 [facility] Gastroenterology Patient Name: Tanya Gillespie Procedure Date: 11/11/2023 7:25 AM MRN: [id number] Account #: [id number] Date of Birth: 05-21-1976 Admit Type: Outpatient Age: 48 Room: [facility] ENDO ROOM 3 Gender: Female Note Status: Finalized Instrument Name: Arvis [id number] Procedure:             Colonoscopy Indications:           Screening for colorectal malignant neoplasm, This is                         the patient's first colonoscopy Providers:             Corinn Jess Brooklyn MD, MD Referring MD:          Angeline MICAEL Laura (Referring MD) Medicines:             General Anesthesia Complications:         No immediate complications. Estimated blood loss: None. Procedure:             Pre-Anesthesia Assessment:                        - Prior to the procedure, a History and Physical was                         performed, and patient medications and allergies were                         reviewed. The patient is competent. The risks and                         benefits of the procedure and the sedation options and                         risks were discussed with the patient. All questions                         were answered and informed consent was obtained.                         Patient identification and proposed procedure were                         verified by the physician, the nurse, the                         anesthesiologist, the anesthetist and the technician                         in the pre-procedure area in the procedure room in the                         endoscopy suite. Mental Status Examination: alert and                         oriented. Airway Examination: normal oropharyngeal                         airway and neck mobility. Respiratory Examination:  clear to auscultation. CV Examination: normal.                         Prophylactic Antibiotics: The patient does not require                         prophylactic  antibiotics. Prior Anticoagulants: The                         patient has taken no anticoagulant or antiplatelet                         agents. ASA Grade Assessment: II - A patient with mild                         systemic disease. After reviewing the risks and                         benefits, the patient was deemed in satisfactory                         condition to undergo the procedure. The anesthesia                         plan was to use general anesthesia. Immediately prior                         to administration of medications, the patient was                         re-assessed for adequacy to receive sedatives. The                         heart rate, respiratory rate, oxygen saturations,                         blood pressure, adequacy of pulmonary ventilation, and                         response to care were monitored throughout the                         procedure. The physical status of the patient was                         re-assessed after the procedure.                        After obtaining informed consent, the colonoscope was                         passed under direct vision. Throughout the procedure,                         the patient's blood pressure, pulse, and oxygen                         saturations were monitored continuously. The  Colonoscope was introduced through the anus and                         advanced to the the cecum, identified by appendiceal                         orifice and ileocecal valve. The colonoscopy was                         performed without difficulty. The patient tolerated                         the procedure well. The quality of the bowel                         preparation was evaluated using the BBPS [facility] Bowel                         Preparation Scale) with scores of: Right Colon = 3,                         Transverse Colon = 3 and Left Colon = 3 (entire mucosa                         seen  well with no residual staining, small fragments                         of stool or opaque liquid). The total BBPS score                         equals 9. The ileocecal valve, appendiceal orifice,                         and rectum were photographed. Findings:      The perianal and digital rectal examinations were normal. Pertinent       negatives include normal sphincter tone and no palpable rectal lesions.      A diminutive polyp was found in the sigmoid colon. The polyp was       sessile. The polyp was removed with a jumbo cold forceps. Resection and       retrieval were complete.      The retroflexed view of the distal rectum and anal verge was normal and       showed no anal or rectal abnormalities.      The exam was otherwise without abnormality. Impression:            - One diminutive polyp in the sigmoid colon, removed                         with a jumbo cold forceps. Resected and retrieved.                        - The distal rectum and anal verge are normal on                         retroflexion view.                        -  The examination was otherwise normal. Recommendation:        - Discharge patient to home (with escort).                        - Resume previous diet today.                        - Continue present medications.                        - Await pathology results.                        - Repeat colonoscopy in 7-10 years for surveillance                         based on pathology results. Procedure Code(s):     --- Professional ---                        [phone], Colonoscopy, flexible; with biopsy, single or                         multiple Diagnosis Code(s):     --- Professional ---                        Z12.11, Encounter for screening for malignant neoplasm                         of colon                        D12.5, Benign neoplasm of sigmoid colon CPT copyright 2022 American Medical Association. All rights reserved. The codes documented in this  report are preliminary and upon coder review may  be revised to meet current compliance requirements. Dr. Angelita Brooklyn Corinn Jess Brooklyn MD, MD 11/11/2023 8:32:05 AM This report has been signed electronically. Number of Addenda: 0 Note Initiated On: 11/11/2023 7:25 AM Scope Withdrawal Time: 0 hours 10 minutes 11 seconds  Total Procedure Duration: 0 hours 13 minutes 55 seconds  Estimated Blood Loss:  Estimated blood loss: none.      [facility]

## 2023-11-11 NOTE — Anesthesia Postprocedure Evaluation (Signed)
 Anesthesia Post Note  Patient: Tanya Gillespie  Procedure(s) Performed: COLONOSCOPY WITH PROPOFOL  POLYPECTOMY  Patient location during evaluation: Endoscopy Anesthesia Type: General Level of consciousness: awake and alert Pain management: pain level controlled Vital Signs Assessment: post-procedure vital signs reviewed and stable Respiratory status: spontaneous breathing, nonlabored ventilation, respiratory function stable and patient connected to nasal cannula oxygen Cardiovascular status: blood pressure returned to baseline and stable Postop Assessment: no apparent nausea or vomiting Anesthetic complications: no   No notable events documented.   Last Vitals:  Vitals:   11/11/23 0833 11/11/23 0843  BP: (!) 92/52 100/61  Pulse:    Resp:  (!) 21  Temp: (!) 36 C   SpO2:      Last Pain:  Vitals:   11/11/23 0843  TempSrc:   PainSc: 0-No pain                 Fairy POUR Zakkery Dorian

## 2023-11-12 ENCOUNTER — Encounter: Payer: Self-pay | Admitting: Gastroenterology

## 2023-11-12 LAB — SURGICAL PATHOLOGY

## 2024-03-06 ENCOUNTER — Other Ambulatory Visit: Payer: Self-pay | Admitting: Obstetrics and Gynecology

## 2024-03-06 DIAGNOSIS — Z1231 Encounter for screening mammogram for malignant neoplasm of breast: Secondary | ICD-10-CM

## 2024-03-14 ENCOUNTER — Ambulatory Visit
Admission: RE | Admit: 2024-03-14 | Discharge: 2024-03-14 | Disposition: A | Discharge status: Home / Self Care | Source: Ambulatory Visit | Attending: Obstetrics and Gynecology | Admitting: Obstetrics and Gynecology

## 2024-03-14 DIAGNOSIS — Z1231 Encounter for screening mammogram for malignant neoplasm of breast: Secondary | ICD-10-CM | POA: Insufficient documentation

## 2024-03-19 ENCOUNTER — Ambulatory Visit: Payer: Self-pay | Admitting: Obstetrics and Gynecology

## 2024-03-22 NOTE — Progress Notes (Signed)
 PCP:  Carollynn Cirri, NP   Chief Complaint  Patient presents with   Gynecologic Exam    Weight gain, "feels off", no sex drive     HPI:      [name] is a 48 y.o. [id number] who LMP was No LMP recorded. (Menstrual status: IUD)., presents today for her annual examination.  Her menses are absent now with IUD. Dysmenorrhea none. No BTB. Has had night sweats for many yrs, now with hot flashes, trouble sleeping. Had elevated FSH/LH with PCP 10/24.  Sex activity: single partner, contraception - IUD. Mirena placed 08/19/16. No pain/bleeding. Does have dryness now, hasn't tried lubricants. Also with no libido now. IUD strings not visible at 4 wk f/u appt, but placement confirmed with u/s in past.  Last Pap: 01/06/22  Results were: no abnormalities /neg HPV DNA.  Hx of STDs: none  Last mammo: 03/14/24 Results were normal, repeat in 12 months. There is a FH of breast cancer in her MGM and pat grt aunt, genetic testing not indicated but done in the past when we thought MGM was younger with breast cancer dx. Pt is BRCA/BART neg 2014.There is no FH of ovarian cancer. The patient does self-breast exams.  Colonoscopy: 11/11/23 with polyp at [facility]; repeat due after 10 yrs  Tobacco use: The patient denies current or previous tobacco use. Alcohol use: social drinker No drug use.  Exercise: very active  She does get adequate calcium and Vitamin D in her diet. Labs with PCP.  Had gained wt this yr and was up to 170#. Has lost 25# with diet/exercise changes this spring.   Past Medical History:  Diagnosis Date   ASCUS of cervix with negative high risk HPV 05/2011   Family history of breast cancer 2014   IBIS=17.9%   Genetic testing of female 06/2013   BRCA/BART NEG   History of Papanicolaou smear of cervix 06/25/2015   NEG   Migraine    DR. MEAD    Past Surgical History:  Procedure Laterality Date   AUGMENTATION MAMMAPLASTY Bilateral 2015   BREAST ENHANCEMENT SURGERY      CESAREAN SECTION  09/21/2006   OLIGO   CESAREAN SECTION  07/11/2009   PREV C/S   COLONOSCOPY WITH PROPOFOL  N/A 11/11/2023   Procedure: COLONOSCOPY WITH PROPOFOL ;  Surgeon: Selena Daily, MD;  Location: [facility] ENDOSCOPY;  Service: Gastroenterology;  Laterality: N/A;   INTRAUTERINE DEVICE (IUD) INSERTION  08/2011   MIRENA   POLYPECTOMY  11/11/2023   Procedure: POLYPECTOMY;  Surgeon: Selena Daily, MD;  Location: [facility] ENDOSCOPY;  Service: Gastroenterology;;    Family History  Problem Relation Age of Onset   Hypertension Father    Migraines Father    Crohn's disease Brother    Breast cancer Maternal Grandmother 77       DECEASED AGE 29   Cancer Maternal Grandfather 47       MELANOMA OF SKIN   Heart disease Paternal Grandfather        MI   Breast cancer Other        MASTECTOMY   Cancer Mother 25       thyroid ca - taken out   Crohn's disease Mother    Alzheimer's disease Paternal Grandmother     Social History   Socioeconomic History   Marital status: Married    Spouse name: Not on file   Number of children: 2   Years of education: 16   Highest education level:  Bachelor's degree (e.g., BA, AB, BS)  Occupational History   Occupation: TEACHER  Tobacco Use   Smoking status: Former    Current packs/day: 0.50    Average packs/day: 0.5 packs/day for 2.0 years (1.0 ttl pk-yrs)    Types: Cigarettes   Smokeless tobacco: Never  Vaping Use   Vaping status: Never Used  Substance and Sexual Activity   Alcohol use: Yes    Alcohol/week: 2.0 standard drinks of alcohol    Types: 2 Cans of beer per week    Comment: OCC   Drug use: No   Sexual activity: Yes    Birth control/protection: I.U.D.    Comment: Mirena  Other Topics Concern   Not on file  Social History Narrative   Not on file   Social Drivers of Health   Financial Resource Strain: Low Risk  (09/16/2023)   Overall Financial Resource Strain (CARDIA)    Difficulty of Paying Living Expenses: Not hard at all   Food Insecurity: No Food Insecurity (09/16/2023)   Hunger Vital Sign    Worried About Running Out of Food in the Last Year: Never true    Ran Out of Food in the Last Year: Never true  Transportation Needs: No Transportation Needs (09/16/2023)   PRAPARE - Administrator, Civil Service (Medical): No    Lack of Transportation (Non-Medical): No  Physical Activity: Insufficiently Active (09/16/2023)   Exercise Vital Sign    Days of Exercise per Week: 2 days    Minutes of Exercise per Session: 20 min  Stress: No Stress Concern Present (09/16/2023)   [employer] of Occupational Health - Occupational Stress Questionnaire    Feeling of Stress : Not at all  Social Connections: Moderately Integrated (09/16/2023)   Social Connection and Isolation Panel [NHANES]    Frequency of Communication with Friends and Family: More than three times a week    Frequency of Social Gatherings with Friends and Family: Once a week    Attends Religious Services: More than 4 times per year    Active Member of [employer] or Organizations: No    Attends Banker Meetings: Not on file    Marital Status: Married  Catering manager Violence: Not At Risk (02/15/2018)   Humiliation, Afraid, Rape, and Kick questionnaire    Fear of Current or Ex-Partner: No    Emotionally Abused: No    Physically Abused: No    Sexually Abused: No    Current Meds  Medication Sig   Cholecalciferol (PA VITAMIN D-3 GUMMY PO) Take by mouth.   levonorgestrel (MIRENA) 20 MCG/24HR IUD 1 each by Intrauterine route once.     ROS:  Review of Systems  Constitutional:  Negative for fatigue, fever and unexpected weight change.  Respiratory:  Negative for cough, shortness of breath and wheezing.   Cardiovascular:  Negative for chest pain, palpitations and leg swelling.  Gastrointestinal:  Negative for blood in stool, constipation, diarrhea, nausea and vomiting.  Endocrine: Negative for cold intolerance, heat intolerance  and polyuria.  Genitourinary:  Negative for dyspareunia, dysuria, flank pain, frequency, genital sores, hematuria, menstrual problem, pelvic pain, urgency, vaginal bleeding, vaginal discharge and vaginal pain.  Musculoskeletal:  Negative for back pain, joint swelling and myalgias.  Skin:  Negative for rash.  Neurological:  Negative for dizziness, syncope, light-headedness, numbness and headaches.  Hematological:  Negative for adenopathy.  Psychiatric/Behavioral:  Negative for agitation, confusion, sleep disturbance and suicidal ideas. The patient is not nervous/anxious.  Objective: BP 102/64   Ht 5\' 7"  (1.702 m)   Wt 145 lb (65.8 kg)   BMI 22.71 kg/m    Physical Exam Constitutional:      Appearance: She is well-developed.  Genitourinary:     Vulva normal.     Genitourinary Comments: IUD STRINGS NOT VISIBLE/PALPABLE     Right Labia: No rash, tenderness or lesions.    Left Labia: No tenderness, lesions or rash.    No vaginal discharge, erythema or tenderness.      Right Adnexa: not tender and no mass present.    Left Adnexa: not tender and no mass present.    No cervical motion tenderness, friability or polyp.     No IUD strings visualized.     Uterus is not enlarged or tender.  Breasts:    Right: No mass, nipple discharge, skin change or tenderness.     Left: No mass, nipple discharge, skin change or tenderness.  Neck:     Thyroid: No thyromegaly.  Cardiovascular:     Rate and Rhythm: Normal rate and regular rhythm.     Heart sounds: Normal heart sounds. No murmur heard. Pulmonary:     Effort: Pulmonary effort is normal.     Breath sounds: Normal breath sounds.  Abdominal:     Palpations: Abdomen is soft.     Tenderness: There is no abdominal tenderness. There is no guarding or rebound.  Musculoskeletal:        General: Normal range of motion.     Cervical back: Normal range of motion.  Lymphadenopathy:     Cervical: No cervical adenopathy.  Neurological:      General: No focal deficit present.     Mental Status: She is alert and oriented to person, place, and time.     Cranial Nerves: No cranial nerve deficit.  Skin:    General: Skin is warm and dry.  Psychiatric:        Mood and Affect: Mood normal.        Behavior: Behavior normal.        Thought Content: Thought content normal.        Judgment: Judgment normal.  Vitals reviewed.     Assessment/Plan: Encounter for annual routine gynecological examination  Encounter for routine checking of intrauterine contraceptive device (IUD)  Encounter for screening mammogram for malignant neoplasm of breast; has 8 yr indication, stings not in cx os  Decreased libido--check menopause labs. Can add topical testosterone prn.   Menopause - Plan: FSH, Estradiol; chck labs. Early for menopause, most likely perimenopausal. Discussed ERT vs veozah for sx; as well as IUD management 10/25. Will f/u with results.   Thyroid disorder screening - Plan: TSH + free T4         GYN counsel breast self exam, mammography screening, adequate intake of calcium and vitamin D, diet and exercise     F/U  Return in about 1 year (around 03/23/2025).  Tyliyah Mcmeekin B. Janaysia Mcleroy, PA-C 03/23/2024 9:54 AM

## 2024-03-23 ENCOUNTER — Ambulatory Visit (INDEPENDENT_AMBULATORY_CARE_PROVIDER_SITE_OTHER): Admitting: Obstetrics and Gynecology

## 2024-03-23 ENCOUNTER — Encounter: Payer: Self-pay | Admitting: Obstetrics and Gynecology

## 2024-03-23 VITALS — BP 102/64 | Ht 67.0 in | Wt 145.0 lb

## 2024-03-23 DIAGNOSIS — Z1231 Encounter for screening mammogram for malignant neoplasm of breast: Secondary | ICD-10-CM

## 2024-03-23 DIAGNOSIS — Z01419 Encounter for gynecological examination (general) (routine) without abnormal findings: Secondary | ICD-10-CM | POA: Diagnosis not present

## 2024-03-23 DIAGNOSIS — Z30431 Encounter for routine checking of intrauterine contraceptive device: Secondary | ICD-10-CM

## 2024-03-23 DIAGNOSIS — Z1329 Encounter for screening for other suspected endocrine disorder: Secondary | ICD-10-CM

## 2024-03-23 DIAGNOSIS — R6882 Decreased libido: Secondary | ICD-10-CM

## 2024-03-23 DIAGNOSIS — Z780 Asymptomatic menopausal state: Secondary | ICD-10-CM

## 2024-03-23 NOTE — Patient Instructions (Signed)
 I value your feedback and you entrusting [place] with your care. If you get a [place] patient survey, I would appreciate you taking the time to let us know about your experience today. Thank you! ? ? ?

## 2024-03-24 LAB — TSH+FREE T4
Free T4: 1.06 ng/dL (ref 0.82–1.77)
TSH: 3.16 u[IU]/mL (ref 0.450–4.500)

## 2024-03-24 LAB — ESTRADIOL: Estradiol: <5 pg/mL

## 2024-03-24 LAB — FOLLICLE STIMULATING HORMONE: FSH: 68.6 m[IU]/mL

## 2024-03-28 ENCOUNTER — Ambulatory Visit: Payer: Self-pay | Admitting: Obstetrics and Gynecology

## 2024-04-27 ENCOUNTER — Encounter: Payer: Self-pay | Admitting: Obstetrics and Gynecology

## 2024-04-30 ENCOUNTER — Other Ambulatory Visit: Payer: Self-pay | Admitting: Obstetrics and Gynecology

## 2024-04-30 MED ORDER — ESTRADIOL 0.5 MG PO TABS: 0.5000 mg | ORAL_TABLET | Freq: Every day | ORAL | 0 refills | Status: DC

## 2024-04-30 NOTE — Progress Notes (Signed)
 Rx estradiol  for early menopause/VS sx. Has mirena IUD

## 2024-07-22 ENCOUNTER — Encounter: Payer: Self-pay | Admitting: Obstetrics and Gynecology

## 2024-07-24 ENCOUNTER — Other Ambulatory Visit: Payer: Self-pay | Admitting: Obstetrics and Gynecology

## 2024-07-24 MED ORDER — ESTRADIOL 0.5 MG PO TABS: 0.5000 mg | ORAL_TABLET | Freq: Every day | ORAL | 2 refills | Status: DC

## 2024-07-24 NOTE — Progress Notes (Signed)
 Rx RF estradiol  for VS sx

## 2024-07-24 NOTE — Telephone Encounter (Signed)
 Replied on another msg

## 2024-08-01 ENCOUNTER — Other Ambulatory Visit: Payer: Self-pay | Admitting: Obstetrics and Gynecology

## 2024-08-01 ENCOUNTER — Encounter: Payer: Self-pay | Admitting: Obstetrics & Gynecology

## 2024-08-01 ENCOUNTER — Ambulatory Visit: Admitting: Obstetrics & Gynecology

## 2024-08-01 ENCOUNTER — Other Ambulatory Visit

## 2024-08-01 VITALS — BP 118/72 | HR 73 | Ht 67.0 in | Wt 141.4 lb

## 2024-08-01 DIAGNOSIS — T8332XS Displacement of intrauterine contraceptive device, sequela: Secondary | ICD-10-CM

## 2024-08-01 DIAGNOSIS — T8332XD Displacement of intrauterine contraceptive device, subsequent encounter: Secondary | ICD-10-CM

## 2024-08-01 DIAGNOSIS — T8332XA Displacement of intrauterine contraceptive device, initial encounter: Secondary | ICD-10-CM | POA: Diagnosis not present

## 2024-08-01 MED ORDER — PROGESTERONE MICRONIZED 100 MG PO CAPS: ORAL_CAPSULE | ORAL | 2 refills | Status: AC

## 2024-08-01 NOTE — Progress Notes (Signed)
 Rx prometrium to add to ERT since IUD removed. F/u prn AUB.

## 2024-08-01 NOTE — Progress Notes (Signed)
    GYNECOLOGY PROGRESS NOTE  Subjective:    Patient ID: Tanya Gillespie, female    DOB: 11/13/1975, 48 y.o.   MRN: [id number]  HPI  Patient is a 48 y.o. married G2P2002 (65 and 106 yo kids) here to have her 19 year old Mirena removed. She is postmenopausal with elevated FSH x 2. She has hot flashes/night sweats previously but estradiol  0.5mg  has helped greatly. Prometrium was recently prescribed since her IUD will be removed/is expired.  The following portions of the patient's history were reviewed and updated as appropriate: allergies, current medications, past family history, past medical history, past social history, past surgical history, and problem list.  Review of Systems Pertinent items are noted in HPI.  Pap negative 2023 Mammo UTD Colonoscopy UTD and normal.  Objective:   Blood pressure 118/72, pulse 73, height 5' 7 (1.702 m), weight 141 lb 6.4 oz (64.1 kg). Body mass index is 22.15 kg/m. Well nourished, well hydrated White female, no apparent distress She is ambulating and conversing normally. Graves speculum placed Stenotic cervix noted. I sprayed the cervix with Hurricaine spray and then used a dilator to open the os enough to use the IUD hook and thread finder. I also tried a long Kelley clamp. I could not find the IUD with any of these methods.  I took her back to the ultrasound room and scan confirmed that there is an IUD in the uterus, at the fundus.   Assessment:  Expired IUD- strings lost and I cannot get it out in the office  Plan:   Taking E/P for menopause symptoms I did speak with her about off label use of OCPs for symptoms if her HRT becomes expensive. I offered a repeat trial of removal in the office after pretreatment with cytotec versus removal in the OR. She opts for removal in the OR.

## 2024-08-04 ENCOUNTER — Encounter
Admission: RE | Admit: 2024-08-04 | Discharge: 2024-08-04 | Disposition: A | Discharge status: Home / Self Care | Source: Ambulatory Visit | Attending: Obstetrics & Gynecology | Admitting: Obstetrics & Gynecology

## 2024-08-04 ENCOUNTER — Encounter: Payer: Self-pay | Admitting: Obstetrics & Gynecology

## 2024-08-04 ENCOUNTER — Telehealth: Payer: Self-pay | Admitting: Obstetrics & Gynecology

## 2024-08-04 NOTE — Patient Instructions (Addendum)
 Your procedure is scheduled on: 08/07/2024  Report to the Registration Desk on the 1st floor of the Medical Mall.  To find out your arrival time, please call [phone] between 1PM - 3PM on: 08/04/2024  If your arrival time is 6:00 am, do not arrive before that time as the Medical Mall entrance doors do not open until 6:00 am.  REMEMBER: Instructions that are not followed completely may result in serious medical risk, up to and including death; or upon the discretion of your surgeon and anesthesiologist your surgery may need to be rescheduled.  Do not eat food or drink anything after midnight the night before surgery.  No gum chewing or hard candies.   One week prior to surgery: Stop Anti-inflammatories (NSAIDS) such as Advil, Aleve, Ibuprofen, Motrin, Naproxen, Naprosyn and Aspirin based products such as Excedrin, Goody's Powder, BC Powder. Stop ANY OVER THE COUNTER supplements until after surgery.  You may however, continue to take Tylenol if needed for pain up until the day of surgery.   Continue taking all of your other prescription medications up until the day of surgery.      No Alcohol for 24 hours before or after surgery.  No Smoking including e-cigarettes for 24 hours before surgery.  No chewable tobacco products for at least 6 hours before surgery.  No nicotine patches on the day of surgery.  Do not use any recreational drugs for at least a week (preferably 2 weeks) before your surgery.  Please be advised that the combination of cocaine and anesthesia may have negative outcomes, up to and including death. If you test positive for cocaine, your surgery will be cancelled.  On the morning of surgery brush your teeth with toothpaste and water, you may rinse your mouth with mouthwash if you wish. Do not swallow any toothpaste or mouthwash.  You may shower on day of surgery  Do not wear jewelry, make-up, hairpins, clips or nail polish.   Do not wear lotions,  powders, or perfumes, creams, ointment or deodorants  Do not shave body hair from the neck down 48 hours before surgery.  Contact lenses, hearing aids and dentures may not be worn into surgery.  Do not bring valuables to the hospital. [facility] is not responsible for any missing/lost belongings or valuables.    Notify your doctor if there is any change in your medical condition (cold, fever, infection).  Wear comfortable clothing (specific to your surgery type) to the hospital.  After surgery, you can help prevent lung complications by doing breathing exercises.   Take deep breaths and cough every 1-2 hours. Your doctor may order a device called an Incentive Spirometer to help you take deep breaths.  If you are being discharged the day of surgery, you will not be allowed to drive home. You will need a responsible individual to drive you home and stay with you for 24 hours after surgery.     Please call the Pre-admissions Testing Dept. at [phone] if you have any questions about these instructions.  Surgery Visitation Policy:  Patients having surgery or a procedure may have two visitors.  Children under the age of 66 must have an adult with them who is not the patient.    Merchandiser, retail to address health-related social needs:  [email or web address]

## 2024-08-04 NOTE — Telephone Encounter (Signed)
 Patient aware Dr.Dove is not able to perform her procedure on 08/07/2024. Patient has agreed to Dr. Leigh performing the surgery in place of Dr. Starla. Patient aware to follow instructions provided during pre admit phone call. Patient voiced understanding.

## 2024-08-07 ENCOUNTER — Other Ambulatory Visit: Payer: Self-pay

## 2024-08-07 ENCOUNTER — Encounter: Payer: Self-pay | Admitting: Obstetrics

## 2024-08-07 ENCOUNTER — Ambulatory Visit
Admission: RE | Admit: 2024-08-07 | Discharge: 2024-08-07 | Disposition: A | Discharge status: Home / Self Care | Attending: Obstetrics | Admitting: Obstetrics

## 2024-08-07 ENCOUNTER — Ambulatory Visit

## 2024-08-07 ENCOUNTER — Ambulatory Visit: Payer: Self-pay | Admitting: Internal Medicine

## 2024-08-07 ENCOUNTER — Encounter
Admission: RE | Disposition: A | Discharge status: Home / Self Care | Payer: Self-pay | Source: Home / Self Care | Attending: Obstetrics

## 2024-08-07 DIAGNOSIS — T8332XD Displacement of intrauterine contraceptive device, subsequent encounter: Secondary | ICD-10-CM

## 2024-08-07 DIAGNOSIS — Z30432 Encounter for removal of intrauterine contraceptive device: Secondary | ICD-10-CM

## 2024-08-07 DIAGNOSIS — T8332XA Displacement of intrauterine contraceptive device, initial encounter: Secondary | ICD-10-CM | POA: Diagnosis present

## 2024-08-07 DIAGNOSIS — Z87891 Personal history of nicotine dependence: Secondary | ICD-10-CM | POA: Diagnosis not present

## 2024-08-07 DIAGNOSIS — Y762 Prosthetic and other implants, materials and accessory obstetric and gynecological devices associated with adverse incidents: Secondary | ICD-10-CM | POA: Insufficient documentation

## 2024-08-07 HISTORY — DX: Displacement of intrauterine contraceptive device, sequela: T83.32XS

## 2024-08-07 LAB — POCT PREGNANCY, URINE: Preg Test, Ur: NEGATIVE

## 2024-08-07 SURGERY — REMOVAL, INTRAUTERINE DEVICE
Anesthesia: General | Site: Uterus

## 2024-08-07 MED ORDER — CHLORHEXIDINE GLUCONATE 0.12 % MT SOLN
OROMUCOSAL | Status: AC
  Filled 2024-08-07: qty 15

## 2024-08-07 MED ORDER — ACETAMINOPHEN 10 MG/ML IV SOLN
INTRAVENOUS | Status: DC | PRN
  Administered 2024-08-07: 1000 mg via INTRAVENOUS

## 2024-08-07 MED ORDER — LIDOCAINE HCL (PF) 2 % IJ SOLN
INTRAMUSCULAR | Status: DC | PRN
  Administered 2024-08-07: 60 mg via INTRADERMAL

## 2024-08-07 MED ORDER — DEXAMETHASONE SODIUM PHOSPHATE 10 MG/ML IJ SOLN
INTRAMUSCULAR | Status: DC | PRN
  Administered 2024-08-07: 10 mg via INTRAVENOUS

## 2024-08-07 MED ORDER — KETOROLAC TROMETHAMINE 30 MG/ML IJ SOLN
INTRAMUSCULAR | Status: AC
  Filled 2024-08-07: qty 1

## 2024-08-07 MED ORDER — LIDOCAINE HCL (PF) 2 % IJ SOLN
INTRAMUSCULAR | Status: AC
  Filled 2024-08-07: qty 5

## 2024-08-07 MED ORDER — SODIUM CHLORIDE 0.9 % IR SOLN
Status: DC | PRN
  Administered 2024-08-07: 605 mL

## 2024-08-07 MED ORDER — ONDANSETRON HCL 4 MG/2ML IJ SOLN
INTRAMUSCULAR | Status: AC
  Filled 2024-08-07: qty 2

## 2024-08-07 MED ORDER — DROPERIDOL 2.5 MG/ML IJ SOLN: 0.6250 mg | Freq: Once | INTRAMUSCULAR | Status: DC | PRN

## 2024-08-07 MED ORDER — LACTATED RINGERS IV SOLN
INTRAVENOUS | Status: DC
Start: 2024-08-07 — End: 2024-08-07

## 2024-08-07 MED ORDER — ACETAMINOPHEN 10 MG/ML IV SOLN: 1000.0000 mg | Freq: Once | INTRAVENOUS | Status: DC | PRN

## 2024-08-07 MED ORDER — CHLORHEXIDINE GLUCONATE 0.12 % MT SOLN
15.0000 mL | Freq: Once | OROMUCOSAL | Status: AC
  Administered 2024-08-07: 15 mL via OROMUCOSAL

## 2024-08-07 MED ORDER — DEXAMETHASONE SODIUM PHOSPHATE 10 MG/ML IJ SOLN
INTRAMUSCULAR | Status: AC
  Filled 2024-08-07: qty 1

## 2024-08-07 MED ORDER — ORAL CARE MOUTH RINSE: 15.0000 mL | Freq: Once | OROMUCOSAL | Status: AC

## 2024-08-07 MED ORDER — DEXMEDETOMIDINE HCL IN NACL 80 MCG/20ML IV SOLN
INTRAVENOUS | Status: DC | PRN
  Administered 2024-08-07 (×2): 4 ug via INTRAVENOUS

## 2024-08-07 MED ORDER — MIDAZOLAM HCL 2 MG/2ML IJ SOLN
INTRAMUSCULAR | Status: AC
  Filled 2024-08-07: qty 2

## 2024-08-07 MED ORDER — KETOROLAC TROMETHAMINE 30 MG/ML IJ SOLN
INTRAMUSCULAR | Status: DC | PRN
  Administered 2024-08-07: 30 mg via INTRAVENOUS

## 2024-08-07 MED ORDER — FENTANYL CITRATE (PF) 100 MCG/2ML IJ SOLN
INTRAMUSCULAR | Status: AC
  Filled 2024-08-07: qty 2

## 2024-08-07 MED ORDER — 0.9 % SODIUM CHLORIDE (POUR BTL) OPTIME
TOPICAL | Status: DC | PRN
  Administered 2024-08-07: 500 mL

## 2024-08-07 MED ORDER — PHENYLEPHRINE 80 MCG/ML (10ML) SYRINGE FOR IV PUSH (FOR BLOOD PRESSURE SUPPORT)
PREFILLED_SYRINGE | INTRAVENOUS | Status: DC | PRN
  Administered 2024-08-07 (×2): 40 ug via INTRAVENOUS

## 2024-08-07 MED ORDER — PROPOFOL 10 MG/ML IV BOLUS
INTRAVENOUS | Status: AC
  Filled 2024-08-07: qty 40

## 2024-08-07 MED ORDER — MIDAZOLAM HCL 2 MG/2ML IJ SOLN
INTRAMUSCULAR | Status: DC | PRN
  Administered 2024-08-07: 2 mg via INTRAVENOUS

## 2024-08-07 MED ORDER — FENTANYL CITRATE (PF) 100 MCG/2ML IJ SOLN
INTRAMUSCULAR | Status: DC | PRN
  Administered 2024-08-07: 25 ug via INTRAVENOUS
  Administered 2024-08-07: 50 ug via INTRAVENOUS
  Administered 2024-08-07: 25 ug via INTRAVENOUS

## 2024-08-07 MED ORDER — PROPOFOL 500 MG/50ML IV EMUL
INTRAVENOUS | Status: DC | PRN
  Administered 2024-08-07: 150 ug/kg/min via INTRAVENOUS

## 2024-08-07 MED ORDER — DIPHENHYDRAMINE HCL 50 MG/ML IJ SOLN
INTRAMUSCULAR | Status: DC | PRN
  Administered 2024-08-07: 12.5 mg via INTRAVENOUS

## 2024-08-07 MED ORDER — ONDANSETRON HCL 4 MG/2ML IJ SOLN
INTRAMUSCULAR | Status: DC | PRN
  Administered 2024-08-07: 4 mg via INTRAVENOUS

## 2024-08-07 MED ORDER — PROPOFOL 10 MG/ML IV BOLUS
INTRAVENOUS | Status: DC | PRN
  Administered 2024-08-07: 60 mg via INTRAVENOUS

## 2024-08-07 SURGICAL SUPPLY — 17 items
DRSG TELFA 3X8 NADH STRL (GAUZE/BANDAGES/DRESSINGS) IMPLANT
GLOVE BIO SURGEON STRL SZ 6 (GLOVE) ×2 IMPLANT
GLOVE BIOGEL PI IND STRL 6 (GLOVE) ×2 IMPLANT
GOWN STRL REUS W/ TWL LRG LVL3 (GOWN DISPOSABLE) ×4 IMPLANT
KIT PROCED FLUENT PRO FLT212S (KITS) IMPLANT
KIT TURNOVER CYSTO (KITS) ×2 IMPLANT
MANIFOLD NEPTUNE II (INSTRUMENTS) ×2 IMPLANT
NS IRRIG 500ML POUR BTL (IV SOLUTION) IMPLANT
PACK DNC HYST (MISCELLANEOUS) ×2 IMPLANT
PAD PREP OB/GYN DISP 24X41 (PERSONAL CARE ITEMS) ×2 IMPLANT
SCRUB CHG 4% DYNA-HEX 4OZ (MISCELLANEOUS) ×2 IMPLANT
SET CYSTO W/LG BORE CLAMP LF (SET/KITS/TRAYS/PACK) IMPLANT
SOL .9 NS 3000ML IRR UROMATIC (IV SOLUTION) IMPLANT
SOLUTION PREP PVP 2OZ (MISCELLANEOUS) ×2 IMPLANT
TOWEL OR 17X26 4PK STRL BLUE (TOWEL DISPOSABLE) ×2 IMPLANT
TRAP FLUID SMOKE EVACUATOR (MISCELLANEOUS) ×2 IMPLANT
WATER STERILE IRR 500ML POUR (IV SOLUTION) ×2 IMPLANT

## 2024-08-07 NOTE — Op Note (Signed)
 HYSTEROSCOPY OPERATIVE REPORT  DATE: 08/07/24  PRE-OP DIAGNOSIS: IUD threads lost, IUD expired, in-office retrieval unsuccessful  POST-OP DIAGNOSIS: Same, IUD removed  PROCEDURE: Hysteroscopy with IUD removal  SURGEON: Dr. Estil Mangle  ANESTHESIA: MAC  IVF:  Distension media in: normal saline; distension media out: normal saline; deficit: 0mL  EBL: 0mL  UOP: 10mL  COMPLICATIONS: None  SPECIMEN: None  FINDINGS: normal-sized anteverted uterus with cervix undilated; hysteroscopy showed IUD in situ. The uterine cavity, bilateral tubal ostia appeared to be normal.  DRAINS: None  CONDITION: Stable to recovery room  PROCEDURE: The risks, benefits, alternatives and indications of the procedure were discussed with the patient. She voiced an understanding of the procedure and informed consent was obtained.  The patient was taken to the OR where anesthesia was administered without difficulty. She was placed in the dorsal lithotomy position using the Allen stirrups. An exam under anesthesia revealed an anteverted uterus with an undilated cervix. The patient was then prepped and draped in the normal sterile fashion.   A weighted speculum was inserted in the posterior aspect of the vagina. A single-tooth tenaculum was used to grasp the anterior lip of the cervix. The cervix with sequentially dilated with Pratt dilators to accommodate the 5mm hysteroscope. A 5mm hysteroscope was introduced under direct visualization, and the uterus was distended with normal saline. The aforementioned findings were noted. The rigid grasper was placed through the hysteroscope and the IUD strings were grasped. The grasper was pulled back, and the hysteroscope was removed with the IUD. The tenaculum was removed from the cervix and good hemostasis was noted at the puncture site.   The patient tolerated the procedure well. Instrument and sponge counts were correct times two. The patient was awakened  and taken to the recovery room in stable condition.   The patient will go home after recovering from anesthesia and meeting criteria for discharge. She was given instructions regarding following up on an as-needed basis.    Estil Mangle, DO [facility]

## 2024-08-07 NOTE — Anesthesia Postprocedure Evaluation (Signed)
 Anesthesia Post Note  Patient: Tanya Gillespie  Procedure(s) Performed: REMOVAL, INTRAUTERINE DEVICE HYSTEROSCOPY (Uterus)  Patient location during evaluation: PACU Anesthesia Type: General Level of consciousness: awake and alert Pain management: pain level controlled Vital Signs Assessment: post-procedure vital signs reviewed and stable Respiratory status: spontaneous breathing, nonlabored ventilation and respiratory function stable Cardiovascular status: blood pressure returned to baseline and stable Postop Assessment: no apparent nausea or vomiting Anesthetic complications: no   No notable events documented.   Last Vitals:  Vitals:   08/07/24 1022 08/07/24 1042  BP: (!) 86/51 (!) 99/59  Pulse: 63   Resp: 18   Temp: 36.4 C   SpO2: 100%     Last Pain:  Vitals:   08/07/24 1042  TempSrc:   PainSc: 0-No pain                 Fairy POUR Staci Carver

## 2024-08-07 NOTE — Transfer of Care (Signed)
 Immediate Anesthesia Transfer of Care Note  Patient: Tanya Gillespie  Procedure(s) Performed: REMOVAL, INTRAUTERINE DEVICE HYSTEROSCOPY, DIAGNOSTIC (Uterus)  Patient Location: PACU  Anesthesia Type:General  Level of Consciousness: awake, alert , and oriented  Airway & Oxygen Therapy: Patient Spontanous Breathing  Post-op Assessment: Report given to RN and Post -op Vital signs reviewed and stable  Post vital signs: Reviewed and stable  Last Vitals:  Vitals Value Taken Time  BP 98/57 08/07/24 09:38  Temp    Pulse 72 08/07/24 09:41  Resp 10 08/07/24 09:41  SpO2 97 % 08/07/24 09:41  Vitals shown include unfiled device data.  Last Pain:  Vitals:   08/07/24 0745  TempSrc: Temporal  PainSc: 0-No pain         Complications: No notable events documented.

## 2024-08-07 NOTE — H&P (Signed)
 GYNECOLOGY PREOPERATIVE HISTORY AND PHYSICAL   Subjective:  Tanya Gillespie is a 48 y.o. [id number] here for surgical management of retained/expired IUD.   Indications for procedure include: IUD strings malpositioned and unable to retrieve in office. No significant preoperative concerns.  Proposed surgery: Hysteroscopy with IUD removal  Pertinent Gynecological History: Menses: amenorrheic w/IUD Bleeding: none Contraception: IUD Last mammogram: normal Date: 03/14/24 Last pap: normal Date: 01/06/22   Past Medical History:  Diagnosis Date   ASCUS of cervix with negative high risk HPV 05/2011   Family history of breast cancer 2014   IBIS=17.9%   Genetic testing of female 06/2013   BRCA/BART NEG   History of Papanicolaou smear of cervix 06/25/2015   NEG   Intrauterine contraceptive device threads lost, sequela    Migraine    DR. MEAD   Past Surgical History:  Procedure Laterality Date   AUGMENTATION MAMMAPLASTY Bilateral 2015   BREAST ENHANCEMENT SURGERY     CESAREAN SECTION  09/21/2006   OLIGO   CESAREAN SECTION  07/11/2009   PREV C/S   COLONOSCOPY WITH PROPOFOL  N/A 11/11/2023   Procedure: COLONOSCOPY WITH PROPOFOL ;  Surgeon: Unk Corinn Skiff, MD;  Location: [facility] END[facility]COPY;  Service: Gastroenterology;  Laterality: N/A;   INTRAUTERINE DEVICE (IUD) INSERTION  08/2011   MIRENA   POLYPECTOMY  11/11/2023   Procedure: POLYPECTOMY;  Surgeon: Unk Corinn Skiff, MD;  Location: [facility] ENDOSCOPY;  Service: Gastroenterology;;   OB History  Gravida Para Term Preterm AB Living  2 2 2   2   SAB IAB Ectopic Multiple Live Births      2    # Outcome Date GA Lbr Len/2nd Weight Sex Type Anes PTL Lv  2 Term 07/11/09   3714 g M CS-LTranv   LIV  1 Term 09/2006   4139 g M CS-LTranv   LIV     Birth Comments: OLIGO  Patient denies any other pertinent gynecologic issues.  Family History  Problem Relation Age of Onset   Hypertension Father    Migraines Father    Crohn's disease Brother    Breast  cancer Maternal Grandmother 49       DECEASED AGE 64   Cancer Maternal Grandfather 71       MELANOMA OF SKIN   Heart disease Paternal Grandfather        MI   Breast cancer Other        MASTECTOMY   Cancer Mother 18       thyroid ca - taken out   Crohn's disease Mother    Alzheimer's disease Paternal Grandmother    Social History   Socioeconomic History   Marital status: Married    Spouse name: Not on file   Number of children: 2   Years of education: 16   Highest education level: Bachelor's degree (e.g., BA, AB, BS)  Occupational History   Occupation: TEACHER  Tobacco Use   Smoking status: Former    Current packs/day: 0.50    Average packs/day: 0.5 packs/day for 2.0 years (1.0 ttl pk-yrs)    Types: Cigarettes   Smokeless tobacco: Never  Vaping Use   Vaping status: Never Used  Substance and Sexual Activity   Alcohol use: Yes    Alcohol/week: 2.0 standard drinks of alcohol    Types: 2 Cans of beer per week    Comment: OCC   Drug use: No   Sexual activity: Yes    Birth control/protection: I.U.D.    Comment: Mirena  Other Topics Concern  Not on file  Social History Narrative   Not on file   Social Drivers of Health   Financial Resource Strain: Low Risk  (08/04/2024)   Overall Financial Resource Strain (CARDIA)    Difficulty of Paying Living Expenses: Not hard at all  Food Insecurity: No Food Insecurity (08/04/2024)   Hunger Vital Sign    Worried About Running Out of Food in the Last Year: Never true    Ran Out of Food in the Last Year: Never true  Transportation Needs: No Transportation Needs (08/04/2024)   PRAPARE - Administrator, Civil Service (Medical): No    Lack of Transportation (Non-Medical): No  Physical Activity: Sufficiently Active (08/04/2024)   Exercise Vital Sign    Days of Exercise per Week: 5 days    Minutes of Exercise per Session: 60 min  Stress: No Stress Concern Present (08/04/2024)   [employer] of Occupational Health -  Occupational Stress Questionnaire    Feeling of Stress: Not at all  Social Connections: Moderately Integrated (08/04/2024)   Social Connection and Isolation Panel    Frequency of Communication with Friends and Family: More than three times a week    Frequency of Social Gatherings with Friends and Family: Once a week    Attends Religious Services: More than 4 times per year    Active Member of [employer] or Organizations: No    Attends Banker Meetings: Not on file    Marital Status: Married  Catering manager Violence: Not At Risk (02/15/2018)   Humiliation, Afraid, Rape, and Kick questionnaire    Fear of Current or Ex-Partner: No    Emotionally Abused: No    Physically Abused: No    Sexually Abused: No   No current facility-administered medications on file prior to encounter.   Current Outpatient Medications on File Prior to Encounter  Medication Sig Dispense Refill   desonide (DESOWEN) 0.05 % cream Apply 1 Application topically 2 (two) times a week.     estradiol  (ESTRACE ) 0.5 MG tablet Take 1 tablet (0.5 mg total) by mouth daily. 90 tablet 2   levonorgestrel (MIRENA) 20 MCG/24HR IUD 1 each by Intrauterine route once.     OVER THE COUNTER MEDICATION Take 4 capsules by mouth daily. Nutrafol     ketoconazole (NIZORAL) 2 % shampoo Apply 1 Application topically daily as needed for irritation. (Patient not taking: Reported on 08/07/2024)     progesterone (PROMETRIUM) 100 MG capsule Take 1 cap nightly, 6 nights on, 1 night off 90 capsule 2   Allergies  Allergen Reactions   Penicillins Hives   Tape     blisters      Review of Systems Constitutional: No recent fever/chills/sweats Respiratory: No recent cough/bronchitis Cardiovascular: No chest pain Gastrointestinal: No recent nausea/vomiting/diarrhea Genitourinary: No UTI symptoms Hematologic/lymphatic:No history of coagulopathy or recent blood thinner use    Objective:   Blood pressure 115/69, pulse 83, temperature (!)  96.9 F (36.1 C), temperature source Temporal, resp. rate 16, height 5' 7 (1.702 m), weight 64.1 kg, SpO2 99%. CONSTITUTIONAL: Well-developed, well-nourished female in no acute distress.  HENT:  Normocephalic, atraumatic, External right and left ear normal. Oropharynx is clear and moist EYES: Conjunctivae and EOM are normal. Pupils are equal, round, and reactive to light. No scleral icterus.  NECK: Normal range of motion, supple, no masses SKIN: Skin is warm and dry. No rash noted. Not diaphoretic. No erythema. No pallor. NEUROLOGIC: Alert and oriented to person, place, and time. Normal reflexes,  muscle tone coordination. No cranial nerve deficit noted. PSYCHIATRIC: Normal mood and affect. Normal behavior. Normal judgment and thought content. CARDIOVASCULAR: Normal heart rate noted, regular rhythm RESPIRATORY: Effort and breath sounds normal, no problems with respiration noted ABDOMEN: Soft, nontender, nondistended. PELVIC: Deferred MUSCULOSKELETAL: Normal range of motion. No edema and no tenderness. 2+ distal pulses.  Labs: Results for orders placed or performed during the hospital encounter of 08/07/24 (from the past 2 weeks)  Pregnancy, urine POC   Collection Time: 08/07/24  7:54 AM  Result Value Ref Range   Preg Test, Ur NEGATIVE NEGATIVE   Imaging Studies: No results found.  Assessment:     48 y.o. [id number] with expired Mirena IUD, strings lost and unable to retrieve in office, here for hysteroscopy with IUD removal.       Plan:    Counseling: Procedure, risks, reasons, benefits and complications (including injury to bowel, bladder, major blood vessel, ureter, bleeding, possibility of transfusion, uterine perforation, infection) Likelihood of success in alleviating the patient's condition was discussed. Routine postoperative instructions will be reviewed with the patient and her family in detail after surgery.  The patient concurred with the proposed plan, giving informed written  consent for the surgery.   To OR when ready   Estil Mangle, DO [facility]

## 2024-08-07 NOTE — Anesthesia Preprocedure Evaluation (Addendum)
 Anesthesia Evaluation  Patient identified by MRN, date of birth, ID band Patient awake    Reviewed: Allergy & Precautions, H&P , NPO status , Patient's Chart, lab work & pertinent test results  Airway Mallampati: II  TM Distance: >3 FB Neck ROM: full    Dental no notable dental hx.    Pulmonary former smoker   Pulmonary exam normal        Cardiovascular negative cardio ROS Normal cardiovascular exam     Neuro/Psych negative neurological ROS  negative psych ROS   GI/Hepatic negative GI ROS, Neg liver ROS,,,  Endo/Other  negative endocrine ROS    Renal/GU negative Renal ROS  negative genitourinary   Musculoskeletal   Abdominal Normal abdominal exam  (+)   Peds  Hematology negative hematology ROS (+)   Anesthesia Other Findings Past Medical History: 05/2011: ASCUS of cervix with negative high risk HPV 2014: Family history of breast cancer     Comment:  IBIS=17.9% 06/2013: Genetic testing of female     Comment:  BRCA/BART NEG 06/25/2015: History of Papanicolaou smear of cervix     Comment:  NEG No date: Intrauterine contraceptive device threads lost, sequela No date: Migraine     Comment:  DR. MEAD  Past Surgical History: 2015: AUGMENTATION MAMMAPLASTY; Bilateral No date: BREAST ENHANCEMENT SURGERY 09/21/2006: CESAREAN SECTION     Comment:  OLIGO 07/11/2009: CESAREAN SECTION     Comment:  PREV C/S 11/11/2023: COLONOSCOPY WITH PROPOFOL ; N/A     Comment:  Procedure: COLONOSCOPY WITH PROPOFOL ;  Surgeon: Unk Corinn Skiff, MD;  Location: [facility];  Service:               Gastroenterology;  Laterality: N/A; 08/2011: INTRAUTERINE DEVICE (IUD) INSERTION     Comment:  MIRENA 11/11/2023: POLYPECTOMY     Comment:  Procedure: POLYPECTOMY;  Surgeon: Unk Corinn Skiff,               MD;  Location: [facility];  Service:               Gastroenterology;;     Reproductive/Obstetrics negative  OB ROS                              Anesthesia Physical Anesthesia Plan  ASA: 1  Anesthesia Plan: General   Post-op Pain Management: Minimal or no pain anticipated   Induction: Intravenous  PONV Risk Score and Plan: Propofol  infusion and TIVA  Airway Management Planned: Natural Airway  Additional Equipment:   Intra-op Plan:   Post-operative Plan:   Informed Consent: I have reviewed the patients History and Physical, chart, labs and discussed the procedure including the risks, benefits and alternatives for the proposed anesthesia with the patient or authorized representative who has indicated his/her understanding and acceptance.     Dental Advisory Given  Plan Discussed with: CRNA and Surgeon  Anesthesia Plan Comments:          Anesthesia Quick Evaluation

## 2024-08-08 ENCOUNTER — Encounter: Payer: Self-pay | Admitting: Obstetrics

## 2024-08-08 ENCOUNTER — Ambulatory Visit (INDEPENDENT_AMBULATORY_CARE_PROVIDER_SITE_OTHER): Admitting: Internal Medicine

## 2024-08-08 VITALS — BP 104/64 | Ht 67.0 in | Wt 143.8 lb

## 2024-08-08 DIAGNOSIS — Z0000 Encounter for general adult medical examination without abnormal findings: Secondary | ICD-10-CM

## 2024-08-08 NOTE — Progress Notes (Signed)
 Subjective:    Patient ID: Tanya Gillespie, female    DOB: 01/05/1976, 48 y.o.   MRN: [id number]  HPI  Patient presents to clinic today for her annual exam.  Flu: 07/2019 Tetanus: 01/2018 COVID: Never Pap smear: 12/2021 Mammogram: 03/2024 Colon screening: 11/2023 Vision screening: annually Dentist: biannually  Diet: She does eat meat. She consumes fruits and veggies. She does eat fried foods. She drinks mostly unsweet tea, dt. soda Exercise: Walking  Review of Systems     Past Medical History:  Diagnosis Date   ASCUS of cervix with negative high risk HPV 05/2011   Family history of breast cancer 2014   IBIS=17.9%   Genetic testing of female 06/2013   BRCA/BART NEG   History of Papanicolaou smear of cervix 06/25/2015   NEG   Intrauterine contraceptive device threads lost, sequela    Migraine    DR. MEAD    Current Outpatient Medications  Medication Sig Dispense Refill   desonide (DESOWEN) 0.05 % cream Apply 1 Application topically 2 (two) times a week.     estradiol  (ESTRACE ) 0.5 MG tablet Take 1 tablet (0.5 mg total) by mouth daily. 90 tablet 2   ketoconazole (NIZORAL) 2 % shampoo Apply 1 Application topically daily as needed for irritation. (Patient not taking: Reported on 08/07/2024)     OVER THE COUNTER MEDICATION Take 4 capsules by mouth daily. Nutrafol     progesterone (PROMETRIUM) 100 MG capsule Take 1 cap nightly, 6 nights on, 1 night off 90 capsule 2   No current facility-administered medications for this visit.    Allergies  Allergen Reactions   Penicillins Hives   Tape     blisters    Family History  Problem Relation Age of Onset   Hypertension Father    Migraines Father    Crohn's disease Brother    Breast cancer Maternal Grandmother 79       DECEASED AGE 29   Cancer Maternal Grandfather 77       MELANOMA OF SKIN   Heart disease Paternal Grandfather        MI   Breast cancer Other        MASTECTOMY   Cancer Mother 48       thyroid ca - taken  out   Crohn's disease Mother    Alzheimer's disease Paternal Grandmother     Social History   Socioeconomic History   Marital status: Married    Spouse name: Not on file   Number of children: 2   Years of education: 16   Highest education level: Bachelor's degree (e.g., BA, AB, BS)  Occupational History   Occupation: TEACHER  Tobacco Use   Smoking status: Former    Current packs/day: 0.50    Average packs/day: 0.5 packs/day for 2.0 years (1.0 ttl pk-yrs)    Types: Cigarettes   Smokeless tobacco: Never  Vaping Use   Vaping status: Never Used  Substance and Sexual Activity   Alcohol use: Yes    Alcohol/week: 2.0 standard drinks of alcohol    Types: 2 Cans of beer per week    Comment: OCC   Drug use: No   Sexual activity: Yes    Birth control/protection: I.U.D.    Comment: Mirena  Other Topics Concern   Not on file  Social History Narrative   Not on file   Social Drivers of Health   Financial Resource Strain: Low Risk  (08/04/2024)   Overall Financial Resource Strain (CARDIA)  Difficulty of Paying Living Expenses: Not hard at all  Food Insecurity: No Food Insecurity (08/04/2024)   Hunger Vital Sign    Worried About Running Out of Food in the Last Year: Never true    Ran Out of Food in the Last Year: Never true  Transportation Needs: No Transportation Needs (08/04/2024)   PRAPARE - Administrator, Civil Service (Medical): No    Lack of Transportation (Non-Medical): No  Physical Activity: Sufficiently Active (08/04/2024)   Exercise Vital Sign    Days of Exercise per Week: 5 days    Minutes of Exercise per Session: 60 min  Stress: No Stress Concern Present (08/04/2024)   [employer] of Occupational Health - Occupational Stress Questionnaire    Feeling of Stress: Not at all  Social Connections: Moderately Integrated (08/04/2024)   Social Connection and Isolation Panel    Frequency of Communication with Friends and Family: More than three times a week     Frequency of Social Gatherings with Friends and Family: Once a week    Attends Religious Services: More than 4 times per year    Active Member of [employer] or Organizations: No    Attends Banker Meetings: Not on file    Marital Status: Married  Catering manager Violence: Not At Risk (02/15/2018)   Humiliation, Afraid, Rape, and Kick questionnaire    Fear of Current or Ex-Partner: No    Emotionally Abused: No    Physically Abused: No    Sexually Abused: No     Constitutional: Denies fever, malaise, fatigue, headache or abnormal weight gain.  HEENT: Denies eye pain, eye redness, ear pain, ringing in the ears, wax buildup, runny nose, nasal congestion, bloody nose, or sore throat. Respiratory: Denies difficulty breathing, shortness of breath, cough or sputum production.   Cardiovascular: Denies chest pain, chest tightness, palpitations or swelling in the hands or feet.  Gastrointestinal: Denies abdominal pain, bloating, constipation, diarrhea or blood in the stool.  GU: Pt reports vaginal dryness. Denies urgency, frequency, pain with urination, burning sensation, blood in urine, odor or discharge. Musculoskeletal: Denies decrease in range of motion, difficulty with gait, muscle pain or joint pain and swelling.  Skin: Denies redness, rashes, lesions or ulcercations.  Neurological: Pt reports hot flashes. Denies dizziness, difficulty with memory, difficulty with speech or problems with balance and coordination.  Psych: Denies anxiety, depression, SI/HI.  No other specific complaints in a complete review of systems (except as listed in HPI above).  Objective:   Physical Exam BP 104/64 (BP Location: Left Arm, Patient Position: Sitting, Cuff Size: Normal)   Ht 5' 7 (1.702 m)   Wt 143 lb 12.8 oz (65.2 kg)   LMP  (LMP Unknown) Comment: had IUD removed yesterday  BMI 22.52 kg/m    Wt Readings from Last 3 Encounters:  08/07/24 141 lb 6.4 oz (64.1 kg)  08/01/24 141 lb 6.4 oz  (64.1 kg)  03/23/24 145 lb (65.8 kg)    General: Appears her stated age, well developed, well nourished, in NAD. Skin: Warm, dry and intact.  HEENT: Head: normal shape and size; Eyes: sclera white, no icterus, conjunctiva pink, PERRLA and EOMs intact;  Neck:  Neck supple, trachea midline. No masses, lumps or thyromegaly present.  Cardiovascular: Normal rate and rhythm. S1,S2 noted.  No murmur, rubs or gallops noted. No JVD or BLE edema.  Pulmonary/Chest: Normal effort and positive vesicular breath sounds. No respiratory distress. No wheezes, rales or ronchi noted.  Abdomen: Soft and  nontender.  Musculoskeletal: Strength 5/5 BUE/BLE No difficulty with gait.  Neurological: Alert and oriented. Cranial nerves II-XII grossly intact. Coordination normal.  Psychiatric: Mood and affect normal. Behavior is normal. Judgment and thought content normal.    BMET    Component Value Date/Time   NA 143 08/05/2023 1526   K 4.6 08/05/2023 1526   CL 102 08/05/2023 1526   CO2 29 08/05/2023 1526   GLUCOSE 89 08/05/2023 1526   BUN 17 08/05/2023 1526   CREATININE 0.73 08/05/2023 1526   CALCIUM 10.0 08/05/2023 1526   GFRNONAA 97 07/25/2020 0902   GFRAA 113 07/25/2020 0902    Lipid Panel     Component Value Date/Time   CHOL 224 (H) 08/05/2023 1526   TRIG 89 08/05/2023 1526   HDL 99 08/05/2023 1526   CHOLHDL 2.3 08/05/2023 1526   LDLCALC 107 (H) 08/05/2023 1526    CBC    Component Value Date/Time   WBC 5.2 08/05/2023 1526   RBC 4.06 08/05/2023 1526   HGB 12.8 08/05/2023 1526   HCT 39.0 08/05/2023 1526   PLT 202 08/05/2023 1526   MCV 96.1 08/05/2023 1526   MCH 31.5 08/05/2023 1526   MCHC 32.8 08/05/2023 1526   RDW 12.3 08/05/2023 1526   LYMPHSABS 972 07/25/2020 0902   EOSABS 79 07/25/2020 0902   BASOSABS 22 07/25/2020 0902    Hgb A1C Lab Results  Component Value Date   HGBA1C 5.1 08/05/2023           Assessment & Plan:   Preventative health maintenance:  Flu shot  declined Tetanus UTD Encouraged her to get her COVID-vaccine Pap smear UTD Mammogram UTD Colon screening UTD Encouraged her to consume a balanced diet and exercise regimen Advised her to see an eye doctor and dentist annually We will check CBC, c-Met, lipid, A1c today    RTC in 1 year, sooner if needed Angeline Laura, NP

## 2024-08-08 NOTE — Patient Instructions (Signed)
 Health Maintenance for Postmenopausal Women Menopause is a normal process in which your ability to get pregnant comes to an end. This process happens slowly over many months or years, usually between the ages of 76 and 38. Menopause is complete when you have missed your menstrual period for 12 months. It is important to talk with your health care provider about some of the most common conditions that affect women after menopause (postmenopausal women). These include heart disease, cancer, and bone loss (osteoporosis). Adopting a healthy lifestyle and getting preventive care can help to promote your health and wellness. The actions you take can also lower your chances of developing some of these common conditions. What are the signs and symptoms of menopause? During menopause, you may have the following symptoms: Hot flashes. These can be moderate or severe. Night sweats. Decrease in sex drive. Mood swings. Headaches. Tiredness (fatigue). Irritability. Memory problems. Problems falling asleep or staying asleep. Talk with your health care provider about treatment options for your symptoms. Do I need hormone replacement therapy? Hormone replacement therapy is effective in treating symptoms that are caused by menopause, such as hot flashes and night sweats. Hormone replacement carries certain risks, especially as you become older. If you are thinking about using estrogen or estrogen with progestin, discuss the benefits and risks with your health care provider. How can I reduce my risk for heart disease and stroke? The risk of heart disease, heart attack, and stroke increases as you age. One of the causes may be a change in the body's hormones during menopause. This can affect how your body uses dietary fats, triglycerides, and cholesterol. Heart attack and stroke are medical emergencies. There are many things that you can do to help prevent heart disease and stroke. Watch your blood pressure High  blood pressure causes heart disease and increases the risk of stroke. This is more likely to develop in people who have high blood pressure readings or are overweight. Have your blood pressure checked: Every 3-5 years if you are 32-23 years of age. Every year if you are 31 years old or older. Eat a healthy diet  Eat a diet that includes plenty of vegetables, fruits, low-fat dairy products, and lean protein. Do not eat a lot of foods that are high in solid fats, added sugars, or sodium. Get regular exercise Get regular exercise. This is one of the most important things you can do for your health. Most adults should: Try to exercise for at least 150 minutes each week. The exercise should increase your heart rate and make you sweat (moderate-intensity exercise). Try to do strengthening exercises at least twice each week. Do these in addition to the moderate-intensity exercise. Spend less time sitting. Even light physical activity can be beneficial. Other tips Work with your health care provider to achieve or maintain a healthy weight. Do not use any products that contain nicotine or tobacco. These products include cigarettes, chewing tobacco, and vaping devices, such as e-cigarettes. If you need help quitting, ask your health care provider. Know your numbers. Ask your health care provider to check your cholesterol and your blood sugar (glucose). Continue to have your blood tested as directed by your health care provider. Do I need screening for cancer? Depending on your health history and family history, you may need to have cancer screenings at different stages of your life. This may include screening for: Breast cancer. Cervical cancer. Lung cancer. Colorectal cancer. What is my risk for osteoporosis? After menopause, you may be  at increased risk for osteoporosis. Osteoporosis is a condition in which bone destruction happens more quickly than new bone creation. To help prevent osteoporosis or  the bone fractures that can happen because of osteoporosis, you may take the following actions: If you are 24-54 years old, get at least 1,000 mg of calcium and at least 600 international units (IU) of vitamin D  per day. If you are older than age 75 but younger than age 30, get at least 1,200 mg of calcium and at least 600 international units (IU) of vitamin D  per day. If you are older than age 8, get at least 1,200 mg of calcium and at least 800 international units (IU) of vitamin D  per day. Smoking and drinking excessive alcohol increase the risk of osteoporosis. Eat foods that are rich in calcium and vitamin D , and do weight-bearing exercises several times each week as directed by your health care provider. How does menopause affect my mental health? Depression may occur at any age, but it is more common as you become older. Common symptoms of depression include: Feeling depressed. Changes in sleep patterns. Changes in appetite or eating patterns. Feeling an overall lack of motivation or enjoyment of activities that you previously enjoyed. Frequent crying spells. Talk with your health care provider if you think that you are experiencing any of these symptoms. General instructions See your health care provider for regular wellness exams and vaccines. This may include: Scheduling regular health, dental, and eye exams. Getting and maintaining your vaccines. These include: Influenza vaccine. Get this vaccine each year before the flu season begins. Pneumonia vaccine. Shingles vaccine. Tetanus, diphtheria, and pertussis (Tdap) booster vaccine. Your health care provider may also recommend other immunizations. Tell your health care provider if you have ever been abused or do not feel safe at home. Summary Menopause is a normal process in which your ability to get pregnant comes to an end. This condition causes hot flashes, night sweats, decreased interest in sex, mood swings, headaches, or lack  of sleep. Treatment for this condition may include hormone replacement therapy. Take actions to keep yourself healthy, including exercising regularly, eating a healthy diet, watching your weight, and checking your blood pressure and blood sugar levels. Get screened for cancer and depression. Make sure that you are up to date with all your vaccines. This information is not intended to replace advice given to you by your health care provider. Make sure you discuss any questions you have with your health care provider. Document Revised: 03/10/2021 Document Reviewed: 03/10/2021 Elsevier Patient Education  2024 [employer].

## 2024-08-09 ENCOUNTER — Ambulatory Visit: Payer: Self-pay | Admitting: Internal Medicine

## 2024-08-09 LAB — CBC
HCT: 37.8 % (ref 35.0–45.0)
Hemoglobin: 12.3 g/dL (ref 11.7–15.5)
MCH: 30.2 pg (ref 27.0–33.0)
MCHC: 32.5 g/dL (ref 32.0–36.0)
MCV: 92.9 fL (ref 80.0–100.0)
MPV: 12 fL (ref 7.5–12.5)
Platelets: 176 Thousand/uL (ref 140–400)
RBC: 4.07 Million/uL (ref 3.80–5.10)
RDW: 13 % (ref 11.0–15.0)
WBC: 9.3 Thousand/uL (ref 3.8–10.8)

## 2024-08-09 LAB — LIPID PANEL
Cholesterol: 175 mg/dL (ref <200)
HDL: 72 mg/dL (ref >=50)
LDL Cholesterol (Calc): 80 mg/dL
Non-HDL Cholesterol (Calc): 103 mg/dL (ref <130)
Total CHOL/HDL Ratio: 2.4 (calc) (ref <5.0)
Triglycerides: 136 mg/dL (ref <150)

## 2024-08-09 LAB — COMPREHENSIVE METABOLIC PANEL WITH GFR
AG Ratio: 1.7 (calc) (ref 1.0–2.5)
ALT: 18 U/L (ref 6–29)
AST: 19 U/L (ref 10–35)
Albumin: 4.2 g/dL (ref 3.6–5.1)
Alkaline phosphatase (APISO): 41 U/L (ref 31–125)
BUN: 10 mg/dL (ref 7–25)
CO2: 29 mmol/L (ref 20–32)
Calcium: 9.4 mg/dL (ref 8.6–10.2)
Chloride: 105 mmol/L (ref 98–110)
Creat: 0.71 mg/dL (ref 0.50–0.99)
Globulin: 2.5 g/dL (ref 1.9–3.7)
Glucose, Bld: 82 mg/dL (ref 65–99)
Potassium: 4.2 mmol/L (ref 3.5–5.3)
Sodium: 141 mmol/L (ref 135–146)
Total Bilirubin: 0.5 mg/dL (ref 0.2–1.2)
Total Protein: 6.7 g/dL (ref 6.1–8.1)
eGFR: 106 mL/min/1.73m2 (ref >=60)

## 2024-08-09 LAB — HEMOGLOBIN A1C
Hgb A1c MFr Bld: 5.1 % (ref <5.7)
Mean Plasma Glucose: 100 mg/dL
eAG (mmol/L): 5.5 mmol/L

## 2024-10-09 ENCOUNTER — Encounter: Admitting: Internal Medicine

## 2024-10-18 ENCOUNTER — Telehealth: Payer: Self-pay

## 2024-10-18 NOTE — Telephone Encounter (Signed)
 Tanya Gillespie called triage about her Progesterone , since having her IUD out and on the progesterone  she's not herself, a lot moody, sleepy, etc. She wants to know If she should schedule and appointment. Please advise.

## 2024-11-22 NOTE — Progress Notes (Signed)
" ° ° °  GYNECOLOGY PROGRESS NOTE  Subjective:  PCP: Antonette Angeline ORN, NP  Patient ID: Annabella JINNY Molt, female    DOB: 1976/07/06, 49 y.o.   MRN: [id number]  HPI  Patient is a 49 y.o. G34P2002 female who presents for menopausal symptoms.  She previously had the Mirena IUD for 8 years, was amenorrheic, [facility] 08/05/23 and 03/23/24 in 60s and was taking Estradiol  0.5mg   for vasomotor symptoms.  She was started on Prometrium  100mg  in Oct in anticipation of her IUD being removed and she says she hasn't felt normal since.  She reports mood swings, extreme fatigue, and no libido.  She also reports having her first period 10/27/24-11/04/24 with very heavy bleeding.  This is the first period since before the Mirena was inserted.  The following portions of the patient's history were reviewed and updated as appropriate: allergies, current medications, past family history, past medical history, past social history, past surgical history, and problem list.  Review of Systems Pertinent items are noted in HPI.   Objective:   Blood pressure 110/83, pulse 81, height 5' 7 (1.702 m), weight 143 lb (64.9 kg), last menstrual period 10/27/2024. Body mass index is 22.4 kg/m.  General appearance: alert and cooperative Abdomen: soft, non-tender; bowel sounds normal; no masses,  no organomegaly Pelvic: deferred Extremities: extremities normal, atraumatic, no cyanosis or edema Neurologic: Grossly normal   Assessment/Plan:   1. Irregular menstrual bleeding   2. Hot flashes   3. Fatigue, unspecified type     49 y.o. [id number], amenorrheic with Mirena for the past several years, and post-menopausal by [facility] 03/2024, now with Mirena removed, on Prometrium , and with period-like bleeding after a viral illness in Dec '25. Vasomotor symptoms are worse, is more tired on the Prometrium .  -Possible pt is actually perimenopausal and this recently bleeding is an irregular period after Mirena removed, will recheck [facility] today -Vasomotor  symptoms worsening, will increase Estradiol  from 0.5 to 1mg  daily, needs to continue Prometrium  100mg  for now, and check TSH, CBC, Vit D  -If FSH confirms post-menopausal, will need EMB   Estil Mangle, DO [facility] [place] "

## 2024-11-28 ENCOUNTER — Ambulatory Visit: Admitting: Obstetrics

## 2024-11-28 ENCOUNTER — Encounter: Payer: Self-pay | Admitting: Obstetrics

## 2024-11-28 VITALS — BP 110/83 | HR 81 | Ht 67.0 in | Wt 143.0 lb

## 2024-11-28 DIAGNOSIS — N926 Irregular menstruation, unspecified: Secondary | ICD-10-CM | POA: Diagnosis not present

## 2024-11-28 DIAGNOSIS — R232 Flushing: Secondary | ICD-10-CM | POA: Insufficient documentation

## 2024-11-28 DIAGNOSIS — R5383 Other fatigue: Secondary | ICD-10-CM

## 2024-11-28 DIAGNOSIS — N951 Menopausal and female climacteric states: Secondary | ICD-10-CM

## 2024-11-28 MED ORDER — ESTRADIOL 1 MG PO TABS: 1.0000 mg | ORAL_TABLET | Freq: Every day | ORAL | 3 refills | Status: AC

## 2024-11-29 ENCOUNTER — Ambulatory Visit: Payer: Self-pay | Admitting: Obstetrics

## 2024-11-29 LAB — CBC
Hematocrit: 42.1 % (ref 34.0–46.6)
Hemoglobin: 13.5 g/dL (ref 11.1–15.9)
MCH: 31 pg (ref 26.6–33.0)
MCHC: 32.1 g/dL (ref 31.5–35.7)
MCV: 97 fL (ref 79–97)
Platelets: 217 10*3/uL (ref 150–450)
RBC: 4.36 x10E6/uL (ref 3.77–5.28)
RDW: 12.8 % (ref 11.7–15.4)
WBC: 5 10*3/uL (ref 3.4–10.8)

## 2024-11-29 LAB — FOLLICLE STIMULATING HORMONE: FSH: 41.7 m[IU]/mL

## 2024-11-29 LAB — VITAMIN D 25 HYDROXY (VIT D DEFICIENCY, FRACTURES): Vit D, 25-Hydroxy: 52.8 ng/mL (ref 30.0–100.0)

## 2024-11-29 LAB — TSH RFX ON ABNORMAL TO FREE T4: TSH: 4.44 u[IU]/mL (ref 0.450–4.500)

## 2024-11-30 NOTE — Progress Notes (Unsigned)
" ° ° °  GYNECOLOGY PROGRESS NOTE  Subjective:  PCP: Tanya Angeline ORN, NP  Patient ID: Tanya Gillespie, female    DOB: 1976/10/20, 49 y.o.   MRN: [id number]  HPI  Patient is a 49 y.o. G13P2002 female who presents for endometrial biopsy for postmenopausal bleeding.  Pt had IUD removed October 2025 and then had a bleed 10/27/24-11/04/24.  FHS = 41.7 on 11/28/24.    {Common ambulatory SmartLinks:19316}  Review of Systems {ros; complete:30496}   Objective:   Last menstrual period 10/27/2024. There is no height or weight on file to calculate BMI.  General appearance: {general exam:16600} Abdomen: {abdominal exam:16834} Pelvic: {pelvic exam:16852::cervix normal in appearance,external genitalia normal,no adnexal masses or tenderness,no cervical motion tenderness,rectovaginal septum normal,uterus normal size, shape, and consistency,vagina normal without discharge} Extremities: {extremity exam:5109} Neurologic: {neuro exam:17854}   Assessment/Plan:   No diagnosis found.   There are no diagnoses linked to this encounter.     Estil Mangle, DO [facility] [place] "

## 2024-12-04 ENCOUNTER — Ambulatory Visit: Admitting: Obstetrics

## 2024-12-04 DIAGNOSIS — N950 Postmenopausal bleeding: Secondary | ICD-10-CM

## 2025-01-03 ENCOUNTER — Ambulatory Visit: Admitting: Obstetrics

## 2025-08-09 ENCOUNTER — Encounter: Admitting: Internal Medicine
